# Patient Record
Sex: Male | Born: 1965 | Hispanic: Yes | Marital: Single | Smoking: Never smoker
Health system: Southern US, Community
[De-identification: ages and names within clinical notes are randomized; demographics above are authoritative.]

## PROBLEM LIST (undated history)

## (undated) DIAGNOSIS — K5090 Crohn's disease, unspecified, without complications: Secondary | ICD-10-CM

## (undated) DIAGNOSIS — E119 Type 2 diabetes mellitus without complications: Secondary | ICD-10-CM

## (undated) HISTORY — DX: Crohn's disease, unspecified, without complications: K50.90

## (undated) HISTORY — DX: Type 2 diabetes mellitus without complications: E11.9

---

## 2014-06-18 ENCOUNTER — Emergency Department (HOSPITAL_COMMUNITY)
Admission: EM | Admit: 2014-06-18 | Discharge: 2014-06-18 | Disposition: A | Discharge status: Home / Self Care | Payer: Self-pay | Source: Home / Self Care | Attending: Family Medicine | Admitting: Family Medicine

## 2014-06-18 ENCOUNTER — Encounter (HOSPITAL_COMMUNITY): Payer: Self-pay | Admitting: *Deleted

## 2014-06-18 DIAGNOSIS — R1030 Lower abdominal pain, unspecified: Secondary | ICD-10-CM

## 2014-06-18 LAB — POCT URINALYSIS DIP (DEVICE)
Bilirubin Urine: NEGATIVE
Glucose, UA: NEGATIVE mg/dL
HGB URINE DIPSTICK: NEGATIVE
Ketones, ur: NEGATIVE mg/dL
Leukocytes, UA: NEGATIVE
Nitrite: NEGATIVE
PROTEIN: NEGATIVE mg/dL
SPECIFIC GRAVITY, URINE: 1.025 (ref 1.005–1.030)
UROBILINOGEN UA: 0.2 mg/dL (ref 0.0–1.0)
pH: 7 (ref 5.0–8.0)

## 2014-06-18 MED ORDER — NAPROXEN 500 MG PO TABS: 500.0000 mg | ORAL_TABLET | Freq: Two times a day (BID) | ORAL | Status: DC

## 2014-06-18 NOTE — ED Notes (Signed)
Pt is here with complaints of 2 weeks history of lower back pain radiating to lower abdomen with painful urination. Pt has history of kidney stone 4 years ago. Pt also reports chills at night.

## 2014-06-18 NOTE — ED Provider Notes (Signed)
CSN: [id number]     Arrival date & time 06/18/14  1143 History   First MD Initiated Contact with Patient 06/18/14 1437     Chief Complaint  Patient presents with  . Urinary Tract Infection   (Consider location/radiation/quality/duration/timing/severity/associated sxs/prior Treatment) HPI      49 year old male presents complaining of possible bladder infection or kidney stones. He says that he has pain across his lower abdomen that radiates bilaterally for the past week and a half. The pain is mild. There are no alleviating or exacerbating factors. He has no associated symptoms apart from some mild lower back pain and occasional mild sensation of chills at night. He denies any risk for STDs. No penile discharge or dysuria. No testicle pain. He feels like he has trouble starting his stream of urine, he denies frequent urination, waking up at night to urinate, or feelings of incomplete emptying of his bladder. He has a history of kidney stones, his they felt somewhat like he feels today. He has a history of an umbilical hernia repair over 10 years ago.  Past Medical History  Diagnosis Date  . Hypoglycemia    History reviewed. No pertinent past surgical history. History reviewed. No pertinent family history. History  Substance Use Topics  . Smoking status: Never Smoker   . Smokeless tobacco: Not on file  . Alcohol Use: Yes    Review of Systems  Constitutional: Positive for chills. Negative for fever.  Gastrointestinal: Positive for abdominal pain. Negative for nausea, vomiting, diarrhea and constipation.  Genitourinary: Negative for flank pain.  Musculoskeletal: Positive for back pain.  All other systems reviewed and are negative.   Allergies  Review of patient's allergies indicates no known allergies.  Home Medications   Prior to Admission medications   Medication Sig Start Date End Date Taking? Authorizing Provider  naproxen (NAPROSYN) 500 MG tablet Take 1 tablet (500 mg total) by  mouth 2 (two) times daily. 06/18/14   Adrian Blackwater Disha Cottam, PA-C   BP 129/83 mmHg  Pulse 82  Temp(Src) 98.4 F (36.9 C) (Oral)  Resp 14  SpO2 98% Physical Exam  Constitutional: He is oriented to person, place, and time. He appears well-developed and well-nourished. No distress.  HENT:  Head: Normocephalic and atraumatic.  Cardiovascular: Normal rate, regular rhythm and normal heart sounds.   Pulmonary/Chest: Effort normal and breath sounds normal. No respiratory distress.  Abdominal: Soft. Bowel sounds are normal. He exhibits no distension and no mass. There is tenderness (mild) in the suprapubic area. There is no rebound, no guarding and no CVA tenderness. No hernia.  Neurological: He is alert and oriented to person, place, and time. Coordination normal.  Skin: Skin is warm and dry. No rash noted. He is not diaphoretic.  Psychiatric: He has a normal mood and affect. Judgment normal.  Nursing note and vitals reviewed.   ED Course  Procedures (including critical care time) Labs Review Labs Reviewed  URINE CULTURE  POCT URINALYSIS DIP (DEVICE)    Imaging Review No results found.   MDM   1. Abdominal pain, lower    Abdominal pain, unclear cause. Urinalysis is normal. He is afebrile and nontoxic but no signs of peritoneal inflammation or acute abdomen. Treat pain with Naprosyn twice a day for a week, follow-up if no improvement in that time. Urine culture sent.      Graylon Good, PA-C 06/18/14 [phone]

## 2014-06-18 NOTE — Discharge Instructions (Signed)

## 2014-06-19 LAB — URINE CULTURE
Colony Count: NO GROWTH
Culture: NO GROWTH

## 2014-07-20 ENCOUNTER — Encounter (HOSPITAL_COMMUNITY): Payer: Self-pay | Admitting: Emergency Medicine

## 2014-07-20 ENCOUNTER — Emergency Department (INDEPENDENT_AMBULATORY_CARE_PROVIDER_SITE_OTHER)
Admission: EM | Admit: 2014-07-20 | Discharge: 2014-07-20 | Disposition: A | Discharge status: Home / Self Care | Payer: Self-pay | Source: Home / Self Care | Attending: Family Medicine | Admitting: Family Medicine

## 2014-07-20 ENCOUNTER — Emergency Department (INDEPENDENT_AMBULATORY_CARE_PROVIDER_SITE_OTHER): Payer: Self-pay

## 2014-07-20 DIAGNOSIS — R1033 Periumbilical pain: Secondary | ICD-10-CM

## 2014-07-20 LAB — POCT URINALYSIS DIP (DEVICE)
BILIRUBIN URINE: NEGATIVE
GLUCOSE, UA: NEGATIVE mg/dL
Hgb urine dipstick: NEGATIVE
Ketones, ur: NEGATIVE mg/dL
LEUKOCYTES UA: NEGATIVE
Nitrite: NEGATIVE
PROTEIN: NEGATIVE mg/dL
Specific Gravity, Urine: 1.025 (ref 1.005–1.030)
UROBILINOGEN UA: 1 mg/dL (ref 0.0–1.0)
pH: 6 (ref 5.0–8.0)

## 2014-07-20 MED ORDER — NAPROXEN 500 MG PO TABS: 500.0000 mg | ORAL_TABLET | Freq: Two times a day (BID) | ORAL | Status: DC

## 2014-07-20 NOTE — ED Notes (Signed)
C/o intermittent abd pain near the naval area; seen here on 5/3; given Naproxen 500 mg w/relief Hx of umbilical hernia Denies fevers, chills, urinary sx Alert, no signs of acute distress.

## 2014-07-20 NOTE — Discharge Instructions (Signed)
Thank you for coming in today.  Please call or see Ms Antionette Char for assistance with your bill.  You may qualify for reduced or free services.  Her phone number is [phone]. Her email is [email or web address]  [facility] [address] [place], [place] [address] [phone]  Dolor abdominal (Abdominal Pain) El dolor puede tener muchas causas. Normalmente la causa del dolor abdominal no es una enfermedad y Scientist, clinical (histocompatibility and immunogenetics) sin TEFL teacher. Frecuentemente puede controlarse y tratarse en casa. Su mdico le Medical sales representative examen fsico y posiblemente solicite anlisis de sangre y radiografas para ayudar a Chief Strategy Officer la gravedad de su dolor. Sin embargo, en [employer], debe transcurrir ms tiempo antes de que se pueda Clinical research associate una causa evidente del dolor. Antes de llegar a ese punto, es posible que su mdico no sepa si necesita ms pruebas o un tratamiento ms profundo. INSTRUCCIONES PARA EL CUIDADO EN EL HOGAR  Est atento al dolor para ver si hay cambios. Las siguientes indicaciones ayudarn a Architectural technologist que pueda sentir:  Altus solo medicamentos de venta libre o recetados, segn las indicaciones del mdico.  No tome laxantes a menos que se lo haya indicado su mdico.  Pruebe con Neomia Dear dieta lquida absoluta (caldo, t o agua) segn se lo indique su mdico. Introduzca gradualmente una dieta normal, segn su tolerancia. SOLICITE ATENCIN MDICA SI:  Tiene dolor abdominal sin explicacin.  Tiene dolor abdominal relacionado con nuseas o diarrea.  Tiene dolor cuando orina o defeca.  Experimenta dolor abdominal que lo despierta de noche.  Tiene dolor abdominal que empeora o mejora cuando come alimentos.  Tiene dolor abdominal que empeora cuando come alimentos grasosos.  Tiene fiebre. SOLICITE ATENCIN MDICA DE INMEDIATO SI:   El dolor no desaparece en un plazo mximo de 2horas.  No deja de (vomitar).  El Engineer, mining  se siente solo en partes del abdomen, como el lado derecho o la parte inferior izquierda del abdomen.  Evaca materia fecal sanguinolenta o negra, de aspecto alquitranado. ASEGRESE DE QUE:  Comprende estas instrucciones.  Controlar su afeccin.  Recibir ayuda de inmediato si no mejora o si empeora. Document Released: 02/01/2005 Document Revised: 02/06/2013 [facility] Patient Information 2015 [place], [place]. This information is not intended to replace advice given to you by your health care provider. Make sure you discuss any questions you have with your health care provider.

## 2014-07-20 NOTE — ED Provider Notes (Signed)
Barry Carson is a 49 y.o. male who presents to Urgent Care today for abdominal pain. Patient had an umbilical hernia repair about 10 years ago. Over the past month or 2 he's had intermittent sharp pain at the right side of the umbilical hernia scar. This is tender to touch. He denies any vomiting or diarrhea fevers or chills. He was seen about a month ago in urgent care and given naproxen which helps. He feels well otherwise. The pain is moderate and interfering with work.   Past Medical History  Diagnosis Date  . Hypoglycemia    History reviewed. No pertinent past surgical history. History  Substance Use Topics  . Smoking status: Never Smoker   . Smokeless tobacco: Not on file  . Alcohol Use: Yes   ROS as above Medications: No current facility-administered medications for this encounter.   Current Outpatient Prescriptions  Medication Sig Dispense Refill  . naproxen (NAPROSYN) 500 MG tablet Take 1 tablet (500 mg total) by mouth 2 (two) times daily. 30 tablet 0   No Known Allergies   Exam:  BP 145/72 mmHg  Pulse 98  Temp(Src) 99.9 F (37.7 C) (Oral)  Resp 16  SpO2 98% Gen: Well NAD HEENT: EOMI,  MMM Lungs: Normal work of breathing. CTABL Heart: RRR no MRG Abd: NABS, Soft. Nondistended, well-appearing mature umbilical hernia scar. Tender palpation right side. No masses palpated. Pain is decreased with abdominal wall flexion. No herniation visible. Exts: Brisk capillary refill, warm and well perfused.   Results for orders placed or performed during the hospital encounter of 07/20/14 (from the past 24 hour(s))  POCT urinalysis dip (device)     Status: None   Collection Time: 07/20/14  1:31 PM  Result Value Ref Range   Glucose, UA NEGATIVE NEGATIVE mg/dL   Bilirubin Urine NEGATIVE NEGATIVE   Ketones, ur NEGATIVE NEGATIVE mg/dL   Specific Gravity, Urine 1.025 1.005 - 1.030   Hgb urine dipstick NEGATIVE NEGATIVE   pH 6.0 5.0 - 8.0   Protein, ur NEGATIVE NEGATIVE  mg/dL   Urobilinogen, UA 1.0 0.0 - 1.0 mg/dL   Nitrite NEGATIVE NEGATIVE   Leukocytes, UA NEGATIVE NEGATIVE   Dg Abd 1 View  07/20/2014   CLINICAL DATA:  Right paraumbilical abdominal pain for 1 month. Hernia repair 20 years ago.  EXAM: ABDOMEN - 1 VIEW  COMPARISON:  None.  FINDINGS: The bowel gas pattern is normal. No radio-opaque calculi or other significant radiographic abnormality are seen. Phleboliths are noted within the anatomic pelvis.  IMPRESSION: Negative abdomen radiographs.   Electronically Signed   By: Marin Roberts M.D.   On: 07/20/2014 14:33    Assessment and Plan: 49 y.o. male with abdominal pain. I think this pain is related to his umbilical hernia repair.   He appears well. He does not have any pain in his abdomen aside from when palpated directly overlying the umbilical hernia scar. I do not think he has an acute abdomen currently. Plan to refer to [facility] community health and wellness for eventual further diagnostic imaging. He will be best served by general surgery evaluation however he does not have health insurance therefore I am unable to make this happen today. Continue naproxen as needed.  Discussed warning signs or symptoms. Please see discharge instructions. Patient expresses understanding.     Rodolph Bong, MD 07/20/14 [phone]

## 2014-11-16 DIAGNOSIS — K5090 Crohn's disease, unspecified, without complications: Secondary | ICD-10-CM

## 2014-11-16 HISTORY — DX: Crohn's disease, unspecified, without complications: K50.90

## 2014-11-19 ENCOUNTER — Emergency Department (HOSPITAL_COMMUNITY): Payer: Self-pay

## 2014-11-19 ENCOUNTER — Encounter (HOSPITAL_COMMUNITY): Payer: Self-pay | Admitting: Emergency Medicine

## 2014-11-19 ENCOUNTER — Observation Stay (HOSPITAL_COMMUNITY)
Admission: EM | Admit: 2014-11-19 | Discharge: 2014-11-21 | Disposition: A | Discharge status: Home / Self Care | Payer: Self-pay | Attending: Internal Medicine | Admitting: Internal Medicine

## 2014-11-19 DIAGNOSIS — L02211 Cutaneous abscess of abdominal wall: Secondary | ICD-10-CM | POA: Insufficient documentation

## 2014-11-19 DIAGNOSIS — K50914 Crohn's disease, unspecified, with abscess: Secondary | ICD-10-CM | POA: Diagnosis present

## 2014-11-19 DIAGNOSIS — K50114 Crohn's disease of large intestine with abscess: Secondary | ICD-10-CM

## 2014-11-19 DIAGNOSIS — K529 Noninfective gastroenteritis and colitis, unspecified: Secondary | ICD-10-CM

## 2014-11-19 DIAGNOSIS — D72829 Elevated white blood cell count, unspecified: Secondary | ICD-10-CM

## 2014-11-19 DIAGNOSIS — K5090 Crohn's disease, unspecified, without complications: Secondary | ICD-10-CM | POA: Diagnosis present

## 2014-11-19 DIAGNOSIS — K651 Peritoneal abscess: Secondary | ICD-10-CM

## 2014-11-19 DIAGNOSIS — K5000 Crohn's disease of small intestine without complications: Principal | ICD-10-CM | POA: Insufficient documentation

## 2014-11-19 LAB — FOLATE: Folate: 27 ng/mL (ref >5.9)

## 2014-11-19 LAB — CBC
HCT: 40.5 % (ref 39.0–52.0)
Hemoglobin: 13.2 g/dL (ref 13.0–17.0)
MCH: 25.7 pg — ABNORMAL LOW (ref 26.0–34.0)
MCHC: 32.6 g/dL (ref 30.0–36.0)
MCV: 78.8 fL (ref 78.0–100.0)
PLATELETS: 436 10*3/uL — AB (ref 150–400)
RBC: 5.14 MIL/uL (ref 4.22–5.81)
RDW: 13.9 % (ref 11.5–15.5)
WBC: 12.6 10*3/uL — AB (ref 4.0–10.5)

## 2014-11-19 LAB — COMPREHENSIVE METABOLIC PANEL
ALT: 25 U/L (ref 17–63)
ANION GAP: 8 (ref 5–15)
AST: 22 U/L (ref 15–41)
Albumin: 3.2 g/dL — ABNORMAL LOW (ref 3.5–5.0)
Alkaline Phosphatase: 71 U/L (ref 38–126)
BUN: 7 mg/dL (ref 6–20)
CALCIUM: 9.3 mg/dL (ref 8.9–10.3)
CO2: 28 mmol/L (ref 22–32)
CREATININE: 0.93 mg/dL (ref 0.61–1.24)
Chloride: 104 mmol/L (ref 101–111)
Glucose, Bld: 114 mg/dL — ABNORMAL HIGH (ref 65–99)
Potassium: 3.9 mmol/L (ref 3.5–5.1)
Sodium: 140 mmol/L (ref 135–145)
Total Bilirubin: 0.3 mg/dL (ref 0.3–1.2)
Total Protein: 7 g/dL (ref 6.5–8.1)

## 2014-11-19 LAB — URINALYSIS, ROUTINE W REFLEX MICROSCOPIC
Bilirubin Urine: NEGATIVE
GLUCOSE, UA: NEGATIVE mg/dL
Hgb urine dipstick: NEGATIVE
KETONES UR: NEGATIVE mg/dL
LEUKOCYTES UA: NEGATIVE
NITRITE: NEGATIVE
Protein, ur: NEGATIVE mg/dL
Specific Gravity, Urine: 1.02 (ref 1.005–1.030)
UROBILINOGEN UA: 1 mg/dL (ref 0.0–1.0)
pH: 7 (ref 5.0–8.0)

## 2014-11-19 LAB — DIFFERENTIAL
BASOS ABS: 0.1 10*3/uL (ref 0.0–0.1)
BASOS PCT: 1 %
Eosinophils Absolute: 0.4 10*3/uL (ref 0.0–0.7)
Eosinophils Relative: 3 %
Lymphocytes Relative: 8 %
Lymphs Abs: 1 10*3/uL (ref 0.7–4.0)
MONO ABS: 1 10*3/uL (ref 0.1–1.0)
Monocytes Relative: 8 %
Neutro Abs: 10.1 10*3/uL — ABNORMAL HIGH (ref 1.7–7.7)
Neutrophils Relative %: 80 %

## 2014-11-19 LAB — C-REACTIVE PROTEIN: CRP: 10.8 mg/dL — ABNORMAL HIGH (ref <1.0)

## 2014-11-19 LAB — IRON AND TIBC
Iron: 22 ug/dL — ABNORMAL LOW (ref 45–182)
SATURATION RATIOS: 8 % — AB (ref 17.9–39.5)
TIBC: 293 ug/dL (ref 250–450)
UIBC: 271 ug/dL

## 2014-11-19 LAB — SEDIMENTATION RATE: Sed Rate: 41 mm/hr — ABNORMAL HIGH (ref 0–16)

## 2014-11-19 LAB — OCCULT BLOOD X 1 CARD TO LAB, STOOL: Fecal Occult Bld: NEGATIVE

## 2014-11-19 LAB — LIPASE, BLOOD: Lipase: 19 U/L — ABNORMAL LOW (ref 22–51)

## 2014-11-19 LAB — FERRITIN: Ferritin: 142 ng/mL (ref 24–336)

## 2014-11-19 LAB — VITAMIN B12: VITAMIN B 12: 307 pg/mL (ref 180–914)

## 2014-11-19 MED ORDER — IOHEXOL 300 MG/ML  SOLN: 25.0000 mL | INTRAMUSCULAR | Status: DC

## 2014-11-19 MED ORDER — IOHEXOL 300 MG/ML  SOLN
25.0000 mL | INTRAMUSCULAR | Status: AC
  Administered 2014-11-19: 25 mL via ORAL

## 2014-11-19 MED ORDER — HYDROMORPHONE HCL 1 MG/ML IJ SOLN: 1.0000 mg | INTRAMUSCULAR | Status: DC | PRN

## 2014-11-19 MED ORDER — SULFASALAZINE 500 MG PO TBEC
1000.0000 mg | DELAYED_RELEASE_TABLET | Freq: Three times a day (TID) | ORAL | Status: DC
Start: 2014-11-19 — End: 2014-11-21
  Administered 2014-11-19 – 2014-11-21 (×5): 1000 mg via ORAL
  Filled 2014-11-19 (×5): qty 2

## 2014-11-19 MED ORDER — ONDANSETRON HCL 4 MG/2ML IJ SOLN
4.0000 mg | Freq: Four times a day (QID) | INTRAMUSCULAR | Status: DC | PRN
  Administered 2014-11-20: 4 mg via INTRAVENOUS
  Filled 2014-11-19: qty 2

## 2014-11-19 MED ORDER — ONDANSETRON HCL 4 MG/2ML IJ SOLN: 4.0000 mg | Freq: Three times a day (TID) | INTRAMUSCULAR | Status: DC | PRN

## 2014-11-19 MED ORDER — ENOXAPARIN SODIUM 40 MG/0.4ML ~~LOC~~ SOLN
40.0000 mg | SUBCUTANEOUS | Status: DC
  Administered 2014-11-19 – 2014-11-20 (×2): 40 mg via SUBCUTANEOUS
  Filled 2014-11-19 (×2): qty 0.4

## 2014-11-19 MED ORDER — GI COCKTAIL ~~LOC~~
30.0000 mL | Freq: Once | ORAL | Status: AC
  Administered 2014-11-19: 30 mL via ORAL
  Filled 2014-11-19: qty 30

## 2014-11-19 MED ORDER — ONDANSETRON HCL 4 MG PO TABS: 4.0000 mg | ORAL_TABLET | Freq: Four times a day (QID) | ORAL | Status: DC | PRN

## 2014-11-19 MED ORDER — IOHEXOL 300 MG/ML  SOLN
100.0000 mL | Freq: Once | INTRAMUSCULAR | Status: AC | PRN
  Administered 2014-11-19: 100 mL via INTRAVENOUS

## 2014-11-19 MED ORDER — MORPHINE SULFATE (PF) 2 MG/ML IV SOLN
2.0000 mg | INTRAVENOUS | Status: DC | PRN
  Administered 2014-11-20: 2 mg via INTRAVENOUS
  Filled 2014-11-19: qty 1

## 2014-11-19 NOTE — H&P (Signed)
Date: 11/19/2014               Barry Carson Name:  Barry Carson MRN: [id number]  DOB: [birth year] Age / Sex: 49 y.o., male   PCP: No primary care provider on file.         Medical Service: Internal Medicine Teaching Service         Attending Physician: Dr. Sid Falcon, MD    First Contact: Florentina Addison Pager: [phone]  Second Contact: Dr. Osa Craver Pager: [phone]       After Hours (After 5p/  First Contact Pager: [phone]  weekends / holidays): Second Contact Pager: [phone]   Chief Complaint: Abdominal Pain  History of Present Illness: Barry Carson is a 49 yo male with PMHx of hernia repair presents to the ED with complaint of a 3 months history of dull periumbilical, LLQ and RLQ abdominal pain. Pain is intermittent, worse with bending over and worse with defecation. He is unaware of anything that makes it better. He does admits to occasional subjective fevers. Overall, the pain has been worsening over the last few months. He has never had pain like this before. He has a remote history of umbilical hernia repair 20 years ago. Pain is severe, 7/10. He also admits to a 15 pound weight loss in the last 3 months, but 6 pounds in the last 2 weeks. This pain is not associated with nausea, vomiting, diarrhea, dysuria, or blood in his stool. He denies any significant family history. He denies alcohol and tobacco use. He denies abusing NSAIDs. He does not take any medications on a daily basis. He does have remote history of kidney stone which passed on its own.   Barry Carson denies any history or current symptoms of arthritis, eye pain, vision changes, skin changes, hx of blood clots.   In the ED, Barry Carson was afebrile, normotensive, tachycardic to 102, satting well on room air. CMET was unremarkable including a normal lipase. CBC revealed a mild leukocytosis of 12.6. UA was normal. CT abdomen/pelvis revealed extensive Crohn's disease of distal ileum and cecucm with focal inflammation in the  mesentery adject at the ileocecal valve with 2 tiny abscesses in the mesentery each <1 cm.   Meds: Current Facility-Administered Medications  Medication Dose Route Frequency Provider Last Rate Last Dose  . enoxaparin (LOVENOX) injection 40 mg  40 mg Subcutaneous Q24H Alexa Sherral Hammers, MD      . ondansetron [facility]) tablet 4 mg  4 mg Oral Q6H PRN Alexa Sherral Hammers, MD       Or  . ondansetron (ZOFRAN) injection 4 mg  4 mg Intravenous Q6H PRN Alexa Sherral Hammers, MD        Allergies: Allergies as of 11/19/2014 - Review Complete 11/19/2014  Allergen Reaction Noted  . Tylenol [acetaminophen] Shortness Of Breath 11/19/2014   Past Medical History  Diagnosis Date  . Hypoglycemia    History reviewed. No pertinent past surgical history. No family history on file. Social History   Social History  . Marital Status: Single    Spouse Name: N/A  . Number of Children: N/A  . Years of Education: N/A   Occupational History  . Not on file.   Social History Main Topics  . Smoking status: Never Smoker   . Smokeless tobacco: Not on file  . Alcohol Use: Yes     Comment: socially  . Drug Use: No  . Sexual Activity: Not on file   Other Topics Concern  . Not  on file   Social History Narrative   Review of Systems: General: Admits to fever, chills, and decreased appetite. Denies fatigue, and diaphoresis.  Respiratory: Denies SOB, cough, and wheezing.   Cardiovascular: Denies chest pain and palpitations.  Gastrointestinal: Admits to abdominal pain. Denies nausea, vomiting, diarrhea, constipation, blood in stool and abdominal distention.  Genitourinary: Denies dysuria, urgency, frequency, hematuria Skin: Denies pallor, rash and wounds.  Neurological: Denies dizziness, headaches, weakness, lightheadedness  Physical Exam: Filed Vitals:   11/19/14 1620 11/19/14 1630 11/19/14 1715 11/19/14 1805  BP: 130/76 123/78 128/85 133/80  Pulse: 102 93  91  Temp:    98.6 F (37 C)  TempSrc:     Oral  Resp: 19   16  Height:      Weight:      SpO2: 96% 95% 98% 94%   General: Vital signs reviewed.  Barry Carson is well-developed and well-nourished, in no acute distress and cooperative with exam.  HEENT: Normocephalic and atraumatic. EOMI, conjunctivae normal, no scleral icterus. Supple, trachea midline, normal ROM.  Cardiovascular: RRR, S1 normal, S2 normal, no murmurs, gallops, or rubs. Pulmonary/Chest: Clear to auscultation bilaterally, no wheezes, rales, or rhonchi. Abdominal: Soft, tender to palpation diffusely-worst in RLQ, but also present in LLQ, RUQ and periumbilically, non-distended, BS +, no masses, organomegaly, or guarding present.  Musculoskeletal: No joint deformities, erythema, or stiffness. Extremities: No lower extremity edema bilaterally Neurological: A&O x3 Skin: Warm, dry and intact. No rashes or erythema. Psychiatric: Normal mood and affect. speech and behavior is normal. Cognition and memory are normal.   Lab results: Basic Metabolic Panel:  Recent Labs  11/19/14 1100  NA 140  K 3.9  CL 104  CO2 28  GLUCOSE 114*  BUN 7  CREATININE 0.93  CALCIUM 9.3   Liver Function Tests:  Recent Labs  11/19/14 1100  AST 22  ALT 25  ALKPHOS 71  BILITOT 0.3  PROT 7.0  ALBUMIN 3.2*    Recent Labs  11/19/14 1100  LIPASE 19*   CBC:  Recent Labs  11/19/14 1100  WBC 12.6*  NEUTROABS 10.1*  HGB 13.2  HCT 40.5  MCV 78.8  PLT 436*   Urinalysis:  Recent Labs  11/19/14 1155  COLORURINE YELLOW  LABSPEC 1.020  PHURINE 7.0  GLUCOSEU NEGATIVE  HGBUR NEGATIVE  BILIRUBINUR NEGATIVE  KETONESUR NEGATIVE  PROTEINUR NEGATIVE  UROBILINOGEN 1.0  NITRITE NEGATIVE  LEUKOCYTESUR NEGATIVE   Imaging results:  Ct Abdomen Pelvis W Contrast  11/19/2014   CLINICAL DATA:  Right lower quadrant pain for 3-4 months. Night sweats.  EXAM: CT ABDOMEN AND PELVIS WITH CONTRAST  TECHNIQUE: Multidetector CT imaging of the abdomen and pelvis was performed using the  standard protocol following bolus administration of intravenous contrast.  CONTRAST:  121m OMNIPAQUE IOHEXOL 300 MG/ML  SOLN  COMPARISON:  None.  FINDINGS: Lower chest:  Normal.  Hepatobiliary: Normal.  Pancreas: Normal.  Spleen: Normal.  Adrenals/Urinary Tract: Normal adrenal glands. Normal kidneys, ureters, and bladder.  Stomach/Bowel: There is mucosal thickening of multiple loops of the distal ileum including the terminal ileum. There is marked inflammation in the mesenteries adjacent to the ileocecal valve with two tiny areas which may represent abscesses in the mesenteric. No free air or free fluid. The appendix is immediately adjacent to the area of inflammation and there is small area of mucosal thickening in the mid appendix which is felt to be due to the adjacent ileitis and not due to appendicitis. There is slight thickening of the mucosa  of the cecum. The remainder of the bowel appears normal.  Vascular/Lymphatic: The vessels are normal. Multiple small reactive nodes in the mesenteries of the bowel.  Reproductive: Small left inguinal hernia containing only fat. Otherwise normal.  Musculoskeletal: No significant abnormality.  IMPRESSION: Findings consistent with extensive Crohn's disease of the distal ileum and cecum with focal inflammation in the mesentery adjacent at the ileocecal valve with 2 tiny abscesses in the mesentery each measuring less than 1 cm in size.   Electronically Signed   By: Lorriane Shire M.D.   On: 11/19/2014 15:36    Assessment & Plan by Problem: Active Problems:   Intra-abdominal abscess (HCC)   Crohn's colitis (A[facility])   Leukocytosis   Crohn disease ([place])  Likely New Diagnosis Mild to Moderate Crohn's Disease: Barry Carson presents with 3 month history of worsening abdominal pain without N/V/D or blood in his stool. He has had associated fevers and weight loss. Afebrile, mildly tachycardic, mild leukocytosis. Normal liver enzymes, negative lipase, normal UA. CT abdomen/pelvis  revealed extensive Crohn's disease of distal ileum and cecucm with focal inflammation in the mesentery adject at the ileocecal valve with 2 tiny abscesses in the mesentery each <1 cm. Presentation is consistent with Crohn's Disease. By definition using the Crohn's Disease Acitivity Index calculator, Barry Carson has mild to moderate Crohn disease. Barry Carson is ambulatory, tolerates a normal diet without dehydration. No evidence of toxicity. However, Barry Carson does have subjective fevers, weight loss, and abdominal pain and tenderness which do increase consideration of moderate to severe Crohn's Disease. Recommendations are to begin sulfasalazine 2-4 grams/day in patients with ileocolitis. If Barry Carson fails this treatment after 3-4 weeks, recommendation would be to start abx or steroids. We will consult GI in the morning for medications assistance and possible colonoscopy/endoscopy with biopsy for diagnosis. These tests may be done as outpatient possibly.  -Regular diet -NPO at midnight for possible EGD/Colonoscopy tomorrow am -Consult GI in the morning -Sulfasalazine 1000 mg TID -Morphine 2 mg IV Q4H prn pain -Avoid NSAIDs -CBC tomorrow am -BMET tomorrow am -CRP -Ferritin -Folate -HIV ab -Iron/TIBC -FOBT -ESR -Vitamin B12 -Vitamin D  DVT/PE ppx: Lovenox SQ QD  Dispo: Disposition is deferred at this time, awaiting improvement of current medical problems. Anticipated discharge in approximately 1-2 day(s).   The Barry Carson does not have a current PCP (No primary care provider on file.) and does need an [facility] hospital follow-up appointment after discharge.  The Barry Carson does not have transportation limitations that hinder transportation to clinic appointments.  Signed: Osa Craver, DO PGY-2 Internal Medicine Resident Pager # [phone] 11/19/2014 6:34 PM

## 2014-11-19 NOTE — ED Notes (Signed)
Patient states RLQ pain x 1 month.   Patient states he has been having flu-like symptoms also.   Patient denies N/V/D.

## 2014-11-19 NOTE — ED Provider Notes (Signed)
CSN: [id number]     Arrival date & time 11/19/14  3474 History   First MD Initiated Contact with Patient 11/19/14 1032     Chief Complaint  Patient presents with  . Abdominal Pain     (Consider location/radiation/quality/duration/timing/severity/associated sxs/prior Treatment) HPI Comments: Patient with past history of umbilical hernia repair presents with complaint of abdominal pain the right side of his umbilicus for the past 2 months. Pain is described as sharp. Patient describes having this pain constantly for 2 months. Prior to this he had similar pain and was seen in urgent care in 5/16 and 6/16 for similar symptoms. Plain films done at that time were negative. Patient was prescribed medication that didn't really help him and the pain went away spontaneously. Patient states that sometimes the pain is worse with eating. Sometimes it will radiate to his back. It is associated with nausea but no vomiting. No diarrhea but patient has had constipation. No urinary symptoms. No blood in the stool. Patient has tried ibuprofen and naproxen without relief. Patient denies heavy alcohol use, does drink occasionally but not to excess. Denies heavy NSAID use.  In addition patient reports "flulike symptoms" over the past 1-1/2-2 weeks. He describes nasal congestion, cough. He has tried OTC flu medications at home.  Patient is a 49 y.o. male presenting with abdominal pain. The history is provided by the patient.  Abdominal Pain Associated symptoms: cough and nausea   Associated symptoms: no chest pain, no diarrhea, no dysuria, no fever, no shortness of breath, no sore throat and no vomiting     Past Medical History  Diagnosis Date  . Hypoglycemia    History reviewed. No pertinent past surgical history. No family history on file. Social History  Substance Use Topics  . Smoking status: Never Smoker   . Smokeless tobacco: None  . Alcohol Use: Yes     Comment: socially    Review of Systems    Constitutional: Negative for fever.  HENT: Positive for congestion and rhinorrhea. Negative for sore throat.   Eyes: Negative for redness.  Respiratory: Positive for cough. Negative for shortness of breath.   Cardiovascular: Negative for chest pain.  Gastrointestinal: Positive for nausea and abdominal pain. Negative for vomiting and diarrhea.  Genitourinary: Negative for dysuria.  Musculoskeletal: Positive for back pain. Negative for myalgias.  Skin: Negative for rash.  Neurological: Negative for headaches.      Allergies  Tylenol  Home Medications   Prior to Admission medications   Medication Sig Start Date End Date Taking? Authorizing Provider  ibuprofen (ADVIL,MOTRIN) 200 MG tablet Take 400 mg by mouth every 6 (six) hours as needed for moderate pain.   Yes Historical Provider, MD  naproxen (NAPROSYN) 500 MG tablet Take 1 tablet (500 mg total) by mouth 2 (two) times daily. Patient not taking: Reported on 11/19/2014 07/20/14   Rodolph Bong, MD   BP 132/82 mmHg  Pulse 92  Temp(Src) 98.5 F (36.9 C) (Oral)  Resp 20  Ht 5\' 11"  (1.803 m)  Wt 194 lb (87.998 kg)  BMI 27.07 kg/m2  SpO2 100%   Physical Exam  Constitutional: He appears well-developed and well-nourished.  HENT:  Head: Normocephalic and atraumatic.  Nose: Nose normal.  Mouth/Throat: Oropharynx is clear and moist. No oropharyngeal exudate.  Eyes: Conjunctivae are normal. Right eye exhibits no discharge. Left eye exhibits no discharge.  Neck: Normal range of motion. Neck supple.  Cardiovascular: Normal rate, regular rhythm and normal heart sounds.   Pulmonary/Chest: Effort  normal and breath sounds normal. No respiratory distress. He has no wheezes. He has no rales.  Abdominal: Soft. Bowel sounds are normal. There is tenderness. There is no rigidity, no rebound, no guarding, no tenderness at McBurney's point and negative Murphy's sign.    Neurological: He is alert.  Skin: Skin is warm and dry.  Psychiatric: He has  a normal mood and affect.  Nursing note and vitals reviewed.   ED Course  Procedures (including critical care time) Labs Review Labs Reviewed  LIPASE, BLOOD - Abnormal; Notable for the following:    Lipase 19 (*)    All other components within normal limits  COMPREHENSIVE METABOLIC PANEL - Abnormal; Notable for the following:    Glucose, Bld 114 (*)    Albumin 3.2 (*)    All other components within normal limits  CBC - Abnormal; Notable for the following:    WBC 12.6 (*)    MCH 25.7 (*)    Platelets 436 (*)    All other components within normal limits  DIFFERENTIAL - Abnormal; Notable for the following:    Neutro Abs 10.1 (*)    All other components within normal limits  URINALYSIS, ROUTINE W REFLEX MICROSCOPIC (NOT AT [facility])    Imaging Review Ct Abdomen Pelvis W Contrast  11/19/2014   CLINICAL DATA:  Right lower quadrant pain for 3-4 months. Night sweats.  EXAM: CT ABDOMEN AND PELVIS WITH CONTRAST  TECHNIQUE: Multidetector CT imaging of the abdomen and pelvis was performed using the standard protocol following bolus administration of intravenous contrast.  CONTRAST:  OMNIPAQUE IOHEXOL 300 MG/ML  SOLN  COMPARISON:  None.  FINDINGS: Lower chest:  Normal.  Hepatobiliary: Normal.  Pancreas: Normal.  Spleen: Normal.  Adrenals/Urinary Tract: Normal adrenal glands. Normal kidneys, ureters, and bladder.  Stomach/Bowel: There is mucosal thickening of multiple loops of the distal ileum including the terminal ileum. There is marked inflammation in the mesenteries adjacent to the ileocecal valve with two tiny areas which may represent abscesses in the mesenteric. No free air or free fluid. The appendix is immediately adjacent to the area of inflammation and there is small area of mucosal thickening in the mid appendix which is felt to be due to the adjacent ileitis and not due to appendicitis. There is slight thickening of the mucosa of the cecum. The remainder of the bowel appears normal.   Vascular/Lymphatic: The vessels are normal. Multiple small reactive nodes in the mesenteries of the bowel.  Reproductive: Small left inguinal hernia containing only fat. Otherwise normal.  Musculoskeletal: No significant abnormality.  IMPRESSION: Findings consistent with extensive Crohn's disease of the distal ileum and cecum with focal inflammation in the mesentery adjacent at the ileocecal valve with 2 tiny abscesses in the mesentery each measuring less than 1 cm in size.   Electronically Signed   By: Francene Boyers M.D.   On: 11/19/2014 15:36   I have personally reviewed and evaluated these images and lab results as part of my medical decision-making.   EKG Interpretation None       11:23 AM Patient seen and examined. Work-up initiated.   Vital signs reviewed and are as follows: BP 132/82 mmHg  Pulse 92  Temp(Src) 98.5 F (36.9 C) (Oral)  Resp 20  Ht  (1.803 m)  Wt 194 lb (87.998 kg)  BMI 27.07 kg/m2  SpO2 100%  4:36 PM Patient updated on results earlier. Given poor follow-up, persistent pain, no reliable outpatient plan, mild leukocytosis -- CT ordered  to evaluate for etiology of abdominal pain. If negative, would treat as peptic ulcer disease.  CT returns showing inflammation possibly due to Crohn's disease. Patient does not have a history of this. CT shows 2 small abscesses. D/w Dr. Clarene Duke.   Patient admitted to internal medicine teaching service.   MDM   Final diagnoses:  Inflammatory bowel disease  Intra-abdominal abscess (HCC)   Admit for above. Patient appears nontoxic.    Renne Crigler, PA-C 11/19/14 1639  Laurence Spates, MD 11/20/14 1249

## 2014-11-19 NOTE — H&P (Signed)
Date: 11/19/2014               Patient Name:  Barry Carson MRN: [id number]  DOB: [birth year] Age / Sex: 49 y.o., male   PCP: No primary care provider on file.              Medical Service: Internal Medicine Teaching Service              Attending Physician: Dr. Sid Falcon, MD    First Contact: Florentina Addison , MS 4 Pager: [phone]  Second Contact: Dr. Osa Craver Pager: [phone]  Third Contact Dr.  Karalee Height: [phone]-       After Hours (After 5p/  First Contact Pager: [phone]  weekends / holidays): Second Contact Pager: [phone]   Chief Complaint:  RLQ Abdominal Pain   History of Present Illness: Of note: [place] is Spanish speaking. History was obtained through a telephone interpreter.   Barry Carson is a 49 y.o. male with a PMH of remote umbilical hernia s/p surgical repair and remote renal calculus who presents to the [facility] ED with a 3 month history of waxing and waning right lower quadrant pain. He states that over the past week the pain has worsened. He describes it as a dull, waxing and waning pain that is present daily and is 7/10 in severity. He also endorses subjective fevers and chills over the past week, an unintentional 15 lb weight loss over the past 3 months. He denies night sweats, nausea, vomiting, diarrhea, hematochezia, joint pain, skin rashes, or visual disturbances. His pain is exacerbated by defecation and bending over. He does not currently take any medications and states that he is allergic to acetaminophen. He denies tobacco and illicit drug use and endorses ETOH of 1-2 beers/week. He denies any personal history of chronic disease and any family history of GI and autoimmune disorders.   Meds: No current facility-administered medications for this encounter.   Current Outpatient Prescriptions  Medication Sig Dispense Refill  . ibuprofen (ADVIL,MOTRIN) 200 MG tablet Take 400 mg by mouth every 6 (six) hours as needed for moderate pain.    .  naproxen (NAPROSYN) 500 MG tablet Take 1 tablet (500 mg total) by mouth 2 (two) times daily. (Patient not taking: Reported on 11/19/2014) 30 tablet 0    Allergies: Allergies as of 11/19/2014 - Review Complete 11/19/2014  Allergen Reaction Noted  . Tylenol [acetaminophen] Shortness Of Breath 11/19/2014   Past Medical History  Diagnosis Date  . Hypoglycemia    History reviewed. No pertinent past surgical history. No family history on file. Social History   Social History  . Marital Status: Single    Spouse Name: N/A  . Number of Children: N/A  . Years of Education: N/A   Occupational History  . Not on file.   Social History Main Topics  . Smoking status: Never Smoker   . Smokeless tobacco: Not on file  . Alcohol Use: Yes     Comment: socially  . Drug Use: No  . Sexual Activity: Not on file   Other Topics Concern  . Not on file   Social History Narrative    Review of Systems: Pertinent items noted in HPI and remainder of comprehensive ROS otherwise negative.  Physical Exam: Blood pressure 123/78, pulse 93, temperature 98.5 F (36.9 C), temperature source Oral, resp. rate 19, height 5' 11" (1.803 m), weight 87.998 kg (194 lb), SpO2 95 %.  Physical Exam  Constitutional: He is oriented to  person, place, and time. He appears well-developed and well-nourished. No distress.  Pleasant Spanish Speaking gentleman   HENT:  Head: Normocephalic and atraumatic.  Right Ear: External ear normal.  Left Ear: External ear normal.  Eyes: Conjunctivae and EOM are normal. No scleral icterus.  Neck: Normal range of motion.  Cardiovascular: Normal rate and regular rhythm.  Exam reveals no gallop and no friction rub.   No murmur heard. Pulmonary/Chest: No respiratory distress. He has no wheezes. He has no rales. He exhibits no tenderness.  Abdominal: Soft. He exhibits no distension and no mass. There is tenderness (hypogastric and right lower quadrant tenderness to palpation ). There  is guarding. There is no rebound.  Surgical scar inferior to umbilicus consistent with hernia repair   Musculoskeletal: Normal range of motion. He exhibits no edema.  Neurological: He is alert and oriented to person, place, and time.  Skin: Skin is warm and dry. No rash noted.  Psychiatric: He has a normal mood and affect.     Lab results: Results for orders placed or performed during the hospital encounter of 11/19/14 (from the past 24 hour(s))  Lipase, blood     Status: Abnormal   Collection Time: 11/19/14 11:00 AM  Result Value Ref Range   Lipase 19 (L) 22 - 51 U/L  Comprehensive metabolic panel     Status: Abnormal   Collection Time: 11/19/14 11:00 AM  Result Value Ref Range   Sodium 140 135 - 145 mmol/L   Potassium 3.9 3.5 - 5.1 mmol/L   Chloride 104 101 - 111 mmol/L   CO2 28 22 - 32 mmol/L   Glucose, Bld 114 (H) 65 - 99 mg/dL   BUN 7 6 - 20 mg/dL   Creatinine, Ser 0.93 0.61 - 1.24 mg/dL   Calcium 9.3 8.9 - 10.3 mg/dL   Total Protein 7.0 6.5 - 8.1 g/dL   Albumin 3.2 (L) 3.5 - 5.0 g/dL   AST 22 15 - 41 U/L   ALT 25 17 - 63 U/L   Alkaline Phosphatase 71 38 - 126 U/L   Total Bilirubin 0.3 0.3 - 1.2 mg/dL   GFR calc non Af Amer >60 >60 mL/min   GFR calc Af Amer >60 >60 mL/min   Anion gap 8 5 - 15  CBC     Status: Abnormal   Collection Time: 11/19/14 11:00 AM  Result Value Ref Range   WBC 12.6 (H) 4.0 - 10.5 K/uL   RBC 5.14 4.22 - 5.81 MIL/uL   Hemoglobin 13.2 13.0 - 17.0 g/dL   HCT 40.5 39.0 - 52.0 %   MCV 78.8 78.0 - 100.0 fL   MCH 25.7 (L) 26.0 - 34.0 pg   MCHC 32.6 30.0 - 36.0 g/dL   RDW 13.9 11.5 - 15.5 %   Platelets 436 (H) 150 - 400 K/uL  Differential     Status: Abnormal   Collection Time: 11/19/14 11:00 AM  Result Value Ref Range   Neutrophils Relative % 80 %   Neutro Abs 10.1 (H) 1.7 - 7.7 K/uL   Lymphocytes Relative 8 %   Lymphs Abs 1.0 0.7 - 4.0 K/uL   Monocytes Relative 8 %   Monocytes Absolute 1.0 0.1 - 1.0 K/uL   Eosinophils Relative 3 %    Eosinophils Absolute 0.4 0.0 - 0.7 K/uL   Basophils Relative 1 %   Basophils Absolute 0.1 0.0 - 0.1 K/uL  Urinalysis, Routine w reflex microscopic (not at [facility])     Status: None  Collection Time: 11/19/14 11:55 AM  Result Value Ref Range   Color, Urine YELLOW YELLOW   APPearance CLEAR CLEAR   Specific Gravity, Urine 1.020 1.005 - 1.030   pH 7.0 5.0 - 8.0   Glucose, UA NEGATIVE NEGATIVE mg/dL   Hgb urine dipstick NEGATIVE NEGATIVE   Bilirubin Urine NEGATIVE NEGATIVE   Ketones, ur NEGATIVE NEGATIVE mg/dL   Protein, ur NEGATIVE NEGATIVE mg/dL   Urobilinogen, UA 1.0 0.0 - 1.0 mg/dL   Nitrite NEGATIVE NEGATIVE   Leukocytes, UA NEGATIVE NEGATIVE     Imaging results:  Ct Abdomen Pelvis W Contrast  11/19/2014   CLINICAL DATA:  Right lower quadrant pain for 3-4 months. Night sweats.  EXAM: CT ABDOMEN AND PELVIS WITH CONTRAST  TECHNIQUE: Multidetector CT imaging of the abdomen and pelvis was performed using the standard protocol following bolus administration of intravenous contrast.  CONTRAST:  171m OMNIPAQUE IOHEXOL 300 MG/ML  SOLN  COMPARISON:  None.  FINDINGS: Lower chest:  Normal.  Hepatobiliary: Normal.  Pancreas: Normal.  Spleen: Normal.  Adrenals/Urinary Tract: Normal adrenal glands. Normal kidneys, ureters, and bladder.  Stomach/Bowel: There is mucosal thickening of multiple loops of the distal ileum including the terminal ileum. There is marked inflammation in the mesenteries adjacent to the ileocecal valve with two tiny areas which may represent abscesses in the mesenteric. No free air or free fluid. The appendix is immediately adjacent to the area of inflammation and there is small area of mucosal thickening in the mid appendix which is felt to be due to the adjacent ileitis and not due to appendicitis. There is slight thickening of the mucosa of the cecum. The remainder of the bowel appears normal.  Vascular/Lymphatic: The vessels are normal. Multiple small reactive nodes in the  mesenteries of the bowel.  Reproductive: Small left inguinal hernia containing only fat. Otherwise normal.  Musculoskeletal: No significant abnormality.  IMPRESSION: Findings consistent with extensive Crohn's disease of the distal ileum and cecum with focal inflammation in the mesentery adjacent at the ileocecal valve with 2 tiny abscesses in the mesentery each measuring less than 1 cm in size.   Electronically Signed   By: JLorriane ShireM.D.   On: 11/19/2014 15:36    Other results:   Assessment & Plan by Problem: Active Problems:   Intra-abdominal abscess ([place]   Crohn's colitis ([facility]   Leukocytosis   Summary: JAdon Gehlhausenis a 49y.o. male who presents with a 3 month history of RLQ abdominal tenderness  In the context of a 15 lb unintentional weight loss. Subsequent abdominal CT concerning for Ileocecal Crohn's colitis with two small mesenteric abscesses.   ##RLQ Abdominal Pain, suspected Crohn's Colitis:  CT abdomen revealed finding consistent w/ extensive Chron's disease of the distal ileum and cecum with focal mesenteric inflammation. WBC 12.6. CDAI scale 203= mild to moderate Crohn's disease. Holding steroids 2/2 to abscess.  -Pain control w/ IV morphine  2 mg q6h for severe pain -Consult GI tomorrow, will need colonoscopy, NPO at midnight  -f/u ESR, CRP, BMP, Serum Iron/Ferritin/B12, folate,Vitamin D, HbA1c -monitor daily CBC  -Sulfasalazine 1000 mg TID (total 3 g/daily) for induction of remission  -Ondansetron 4 mg Q6 PRN for nausea   ##Intra-abdominal Abscess:  Mesenteric abscesses appreciated on abdominal CT, likely secondary to Crohn's disease. Concern for risk of sepsis given elevated WBC 12.6.  Patient currently stable. Consult GI for medical vs drainage vs surgical management -Consult GI in the morning  -Blood culture if spike fever -Low threshold  for Ab therapy if spike fever >38.5 or hypotensive  ##Leukocytosis: WBC 12.6 with left shift on admission. Likely 2/2 to  active Crohn's. Currently afebrile.  -Consider antibiotic therapy if spike fever  -Blood culture if spike fever >38.5 -Monitor daily CBC  ##Infectious Disease: LFT normal, no concern for hepatitis -f/u HIV   ##DVT PPx: -Lovenox 40 SQ daily   This is a Careers information officer Note.  The care of the patient was discussed with Dr. Marvel Plan  and the assessment and plan was formulated with their assistance.  Please see their note for official documentation of the patient encounter.   Signed: Florentina Addison, Med Student 11/19/2014, 4:53 PM

## 2014-11-19 NOTE — ED Notes (Signed)
Admitting MD at bedside.

## 2014-11-19 NOTE — ED Notes (Signed)
Report attempted 

## 2014-11-19 NOTE — ED Notes (Signed)
Pt reports cold chills at night with fever, has not checked temperature at home.  Denies congestion, body aches, cough, or other symptoms.

## 2014-11-20 DIAGNOSIS — K50914 Crohn's disease, unspecified, with abscess: Secondary | ICD-10-CM | POA: Diagnosis present

## 2014-11-20 DIAGNOSIS — K50014 Crohn's disease of small intestine with abscess: Secondary | ICD-10-CM

## 2014-11-20 LAB — CBC
HCT: 40.3 % (ref 39.0–52.0)
HEMOGLOBIN: 13.1 g/dL (ref 13.0–17.0)
MCH: 25.7 pg — AB (ref 26.0–34.0)
MCHC: 32.5 g/dL (ref 30.0–36.0)
MCV: 79.2 fL (ref 78.0–100.0)
Platelets: 446 10*3/uL — ABNORMAL HIGH (ref 150–400)
RBC: 5.09 MIL/uL (ref 4.22–5.81)
RDW: 14.2 % (ref 11.5–15.5)
WBC: 11.7 10*3/uL — ABNORMAL HIGH (ref 4.0–10.5)

## 2014-11-20 LAB — BASIC METABOLIC PANEL
ANION GAP: 9 (ref 5–15)
BUN: 8 mg/dL (ref 6–20)
CHLORIDE: 99 mmol/L — AB (ref 101–111)
CO2: 27 mmol/L (ref 22–32)
Calcium: 8.8 mg/dL — ABNORMAL LOW (ref 8.9–10.3)
Creatinine, Ser: 0.95 mg/dL (ref 0.61–1.24)
GFR calc Af Amer: >60 mL/min (ref >60)
GFR calc non Af Amer: >60 mL/min (ref >60)
GLUCOSE: 98 mg/dL (ref 65–99)
POTASSIUM: 4.1 mmol/L (ref 3.5–5.1)
Sodium: 135 mmol/L (ref 135–145)

## 2014-11-20 LAB — HEMOGLOBIN A1C
Hgb A1c MFr Bld: 6.6 % — ABNORMAL HIGH (ref 4.8–5.6)
Mean Plasma Glucose: 143 mg/dL

## 2014-11-20 LAB — HIV ANTIBODY (ROUTINE TESTING W REFLEX): HIV SCREEN 4TH GENERATION: NONREACTIVE

## 2014-11-20 MED ORDER — CIPROFLOXACIN HCL 500 MG PO TABS
500.0000 mg | ORAL_TABLET | Freq: Two times a day (BID) | ORAL | Status: DC
  Administered 2014-11-20 – 2014-11-21 (×3): 500 mg via ORAL
  Filled 2014-11-20 (×3): qty 1

## 2014-11-20 MED ORDER — PEG 3350-KCL-NA BICARB-NACL 420 G PO SOLR
4000.0000 mL | Freq: Once | ORAL | Status: AC
  Administered 2014-11-20: 4000 mL via ORAL
  Filled 2014-11-20: qty 4000

## 2014-11-20 MED ORDER — METRONIDAZOLE 500 MG PO TABS
500.0000 mg | ORAL_TABLET | Freq: Three times a day (TID) | ORAL | Status: DC
  Administered 2014-11-20 – 2014-11-21 (×5): 500 mg via ORAL
  Filled 2014-11-20 (×4): qty 1

## 2014-11-20 NOTE — Progress Notes (Signed)
Subjective:  There were no acute overnight events  Patient states that he is feeling improved. Rates his abdominal pain as 6/10, down from 7/10 at admission. He denies nausea, vomiting, diarrhea, chest pain ,hematochezia and SOB. Discussed possibility of upper and lower endoscopy.      Objective:   Temp (24hrs), Avg:98.3 F (36.8 C), Min:97.4 F (36.3 C), Max:98.6 F (37 C)    Vital signs in last 24 hours: Filed Vitals:   11/19/14 1715 11/19/14 1805 11/19/14 2230 11/20/14 0452  BP: 128/85 133/80 124/77 108/80  Pulse:  91 97 89  Temp:  98.6 F (37 C) 98.5 F (36.9 C) 97.4 F (36.3 C)  TempSrc:  Oral Oral Oral  Resp:  16 18 18   Height:      Weight:      SpO2: 98% 94% 98% 97%   Weight change:  No intake or output data in the 24 hours ending 11/20/14 9476 Physical Exam  Constitutional: He appears well-developed and well-nourished. No distress.  HENT:  Head: Normocephalic.  Eyes: Conjunctivae and EOM are normal. No scleral icterus.  Neck: Normal range of motion. No JVD present.  Cardiovascular: Normal rate and regular rhythm.  Exam reveals no gallop and no friction rub.   No murmur heard. Pulmonary/Chest: Effort normal and breath sounds normal. He has no wheezes.  Abdominal: Soft. There is tenderness (Hypogastric and RLQ).  Musculoskeletal: Normal range of motion. He exhibits no edema.  Lymphadenopathy:    He has no cervical adenopathy.  Neurological: He is alert.  Skin: Skin is warm and dry.  Psychiatric: He has a normal mood and affect.    Lab Results: Results for orders placed or performed during the hospital encounter of 11/19/14 (from the past 48 hour(s))  Lipase, blood     Status: Abnormal   Collection Time: 11/19/14 11:00 AM  Result Value Ref Range   Lipase 19 (L) 22 - 51 U/L  Comprehensive metabolic panel     Status: Abnormal   Collection Time: 11/19/14 11:00 AM  Result Value Ref Range   Sodium 140 135 - 145 mmol/L   Potassium 3.9 3.5 - 5.1 mmol/L     Chloride 104 101 - 111 mmol/L   CO2 28 22 - 32 mmol/L   Glucose, Bld 114 (H) 65 - 99 mg/dL   BUN 7 6 - 20 mg/dL   Creatinine, Ser 0.93 0.61 - 1.24 mg/dL   Calcium 9.3 8.9 - 10.3 mg/dL   Total Protein 7.0 6.5 - 8.1 g/dL   Albumin 3.2 (L) 3.5 - 5.0 g/dL   AST 22 15 - 41 U/L   ALT 25 17 - 63 U/L   Alkaline Phosphatase 71 38 - 126 U/L   Total Bilirubin 0.3 0.3 - 1.2 mg/dL   GFR calc non Af Amer >60 >60 mL/min   GFR calc Af Amer >60 >60 mL/min    Comment: (NOTE) The eGFR has been calculated using the CKD EPI equation. This calculation has not been validated in all clinical situations. eGFR's persistently <60 mL/min signify possible Chronic Kidney Disease.    Anion gap 8 5 - 15  CBC     Status: Abnormal   Collection Time: 11/19/14 11:00 AM  Result Value Ref Range   WBC 12.6 (H) 4.0 - 10.5 K/uL   RBC 5.14 4.22 - 5.81 MIL/uL   Hemoglobin 13.2 13.0 - 17.0 g/dL   HCT 40.5 39.0 - 52.0 %   MCV 78.8 78.0 - 100.0 fL   MCH 25.7 (  L) 26.0 - 34.0 pg   MCHC 32.6 30.0 - 36.0 g/dL   RDW 13.9 11.5 - 15.5 %   Platelets 436 (H) 150 - 400 K/uL  Differential     Status: Abnormal   Collection Time: 11/19/14 11:00 AM  Result Value Ref Range   Neutrophils Relative % 80 %   Neutro Abs 10.1 (H) 1.7 - 7.7 K/uL   Lymphocytes Relative 8 %   Lymphs Abs 1.0 0.7 - 4.0 K/uL   Monocytes Relative 8 %   Monocytes Absolute 1.0 0.1 - 1.0 K/uL   Eosinophils Relative 3 %   Eosinophils Absolute 0.4 0.0 - 0.7 K/uL   Basophils Relative 1 %   Basophils Absolute 0.1 0.0 - 0.1 K/uL  Urinalysis, Routine w reflex microscopic (not at [facility])     Status: None   Collection Time: 11/19/14 11:55 AM  Result Value Ref Range   Color, Urine YELLOW YELLOW   APPearance CLEAR CLEAR   Specific Gravity, Urine 1.020 1.005 - 1.030   pH 7.0 5.0 - 8.0   Glucose, UA NEGATIVE NEGATIVE mg/dL   Hgb urine dipstick NEGATIVE NEGATIVE   Bilirubin Urine NEGATIVE NEGATIVE   Ketones, ur NEGATIVE NEGATIVE mg/dL   Protein, ur NEGATIVE  NEGATIVE mg/dL   Urobilinogen, UA 1.0 0.0 - 1.0 mg/dL   Nitrite NEGATIVE NEGATIVE   Leukocytes, UA NEGATIVE NEGATIVE    Comment: MICROSCOPIC NOT DONE ON URINES WITH NEGATIVE PROTEIN, BLOOD, LEUKOCYTES, NITRITE, OR GLUCOSE <1000 mg/dL.  HIV antibody     Status: None   Collection Time: 11/19/14  7:15 PM  Result Value Ref Range   HIV Screen 4th Generation wRfx Non Reactive Non Reactive    Comment: (NOTE) Performed At: [facility] [place], [place] [id number] Lindon Romp MD [id number]   Sedimentation rate     Status: Abnormal   Collection Time: 11/19/14  7:15 PM  Result Value Ref Range   Sed Rate 41 (H) 0 - 16 mm/hr  C-reactive protein     Status: Abnormal   Collection Time: 11/19/14  7:15 PM  Result Value Ref Range   CRP 10.8 (H) <1.0 mg/dL  Vitamin B12     Status: None   Collection Time: 11/19/14  7:15 PM  Result Value Ref Range   Vitamin B-12 307 180 - 914 pg/mL    Comment: (NOTE) This assay is not validated for testing neonatal or myeloproliferative syndrome specimens for Vitamin B12 levels.   Iron and TIBC     Status: Abnormal   Collection Time: 11/19/14  7:15 PM  Result Value Ref Range   Iron 22 (L) 45 - 182 ug/dL   TIBC 293 250 - 450 ug/dL   Saturation Ratios 8 (L) 17.9 - 39.5 %   UIBC 271 ug/dL  Ferritin     Status: None   Collection Time: 11/19/14  7:15 PM  Result Value Ref Range   Ferritin 142 24 - 336 ng/mL  Folate     Status: None   Collection Time: 11/19/14  7:15 PM  Result Value Ref Range   Folate 27.0 >5.9 ng/mL  Occult blood card to lab, stool RN will collect     Status: None   Collection Time: 11/19/14 11:05 PM  Result Value Ref Range   Fecal Occult Bld NEGATIVE NEGATIVE  CBC     Status: Abnormal   Collection Time: 11/20/14  5:30 AM  Result Value Ref Range   WBC 11.7 (H) 4.0 - 10.5 K/uL  RBC 5.09 4.22 - 5.81 MIL/uL   Hemoglobin 13.1 13.0 - 17.0 g/dL   HCT 40.3 39.0 - 52.0 %   MCV 79.2 78.0 - 100.0 fL   MCH 25.7 (L)  26.0 - 34.0 pg   MCHC 32.5 30.0 - 36.0 g/dL   RDW 14.2 11.5 - 15.5 %   Platelets 446 (H) 150 - 400 K/uL  Basic metabolic panel     Status: Abnormal   Collection Time: 11/20/14  5:30 AM  Result Value Ref Range   Sodium 135 135 - 145 mmol/L   Potassium 4.1 3.5 - 5.1 mmol/L   Chloride 99 (L) 101 - 111 mmol/L   CO2 27 22 - 32 mmol/L   Glucose, Bld 98 65 - 99 mg/dL   BUN 8 6 - 20 mg/dL   Creatinine, Ser 0.95 0.61 - 1.24 mg/dL   Calcium 8.8 (L) 8.9 - 10.3 mg/dL   GFR calc non Af Amer >60 >60 mL/min   GFR calc Af Amer >60 >60 mL/min    Comment: (NOTE) The eGFR has been calculated using the CKD EPI equation. This calculation has not been validated in all clinical situations. eGFR's persistently <60 mL/min signify possible Chronic Kidney Disease.    Anion gap 9 5 - 15   Micro Results: No results found for this or any previous visit (from the past 240 hour(s)). Studies/Results: Ct Abdomen Pelvis W Contrast  11/19/2014   CLINICAL DATA:  Right lower quadrant pain for 3-4 months. Night sweats.  EXAM: CT ABDOMEN AND PELVIS WITH CONTRAST  TECHNIQUE: Multidetector CT imaging of the abdomen and pelvis was performed using the standard protocol following bolus administration of intravenous contrast.  CONTRAST:  198m OMNIPAQUE IOHEXOL 300 MG/ML  SOLN  COMPARISON:  None.  FINDINGS: Lower chest:  Normal.  Hepatobiliary: Normal.  Pancreas: Normal.  Spleen: Normal.  Adrenals/Urinary Tract: Normal adrenal glands. Normal kidneys, ureters, and bladder.  Stomach/Bowel: There is mucosal thickening of multiple loops of the distal ileum including the terminal ileum. There is marked inflammation in the mesenteries adjacent to the ileocecal valve with two tiny areas which may represent abscesses in the mesenteric. No free air or free fluid. The appendix is immediately adjacent to the area of inflammation and there is small area of mucosal thickening in the mid appendix which is felt to be due to the adjacent ileitis  and not due to appendicitis. There is slight thickening of the mucosa of the cecum. The remainder of the bowel appears normal.  Vascular/Lymphatic: The vessels are normal. Multiple small reactive nodes in the mesenteries of the bowel.  Reproductive: Small left inguinal hernia containing only fat. Otherwise normal.  Musculoskeletal: No significant abnormality.  IMPRESSION: Findings consistent with extensive Crohn's disease of the distal ileum and cecum with focal inflammation in the mesentery adjacent at the ileocecal valve with 2 tiny abscesses in the mesentery each measuring less than 1 cm in size.   Electronically Signed   By: JLorriane ShireM.D.   On: 11/19/2014 15:36   Medications: I have reviewed the patient's current medications. Scheduled Meds: . enoxaparin (LOVENOX) injection  40 mg Subcutaneous Q24H  . sulfaSALAzine  1,000 mg Oral TID   Continuous Infusions:  PRN Meds:.morphine injection, ondansetron **OR** ondansetron (ZOFRAN) IV Assessment/Plan: Active Problems:   Intra-abdominal abscess (HCC)   Crohn's colitis ([place]   Leukocytosis   Cro[place] disease ([place]  Summary: Barry Danielsenis a 49y.o. male who presents with a 3 month history of RLQ abdominal  tenderness In the context of a 15 lb unintentional weight loss. Subsequent abdominal CT concerning for Ileocecal Crohn's colitis with two small mesenteric abscesses.   ##RLQ Abdominal Pain, suspected Crohn's Colitis: CT abdomen revealed finding consistent w/ extensive Crohn's disease of the distal ileum and cecum with focal mesenteric inflammation. CDAI scale 203, inidicative of mild/moderate Crohns disease. WBC 12.6 on admission has downtrended to 11.7. ESR and CRP elevated at 41 and 10.8 respectively. FOBT negative. Iron and TIBC low with normal hemoglobin and ferritin may be indicative of early iron-deficiency anemia. GI saw patient and is planning of upper endoscopy and colonoscopy on 10/6.  -NPO at midnight in anticipation of  colonoscopy on 10/5 -Clear Diet until midnight in anticipation of colonoscopy on 10/5 -Pain control w/ IV morphine 2 mg q6h for severe pain -Sulfasalazine 1000 mg TID (total 3 g/daily) for induction of remission  -Ondansetron 4 mg Q6 PRN for nausea   -Vitamin D pending -BMP tommorrow  ##Intra-abdominal Abscess: Mesenteric abscesses appreciated on abdominal CT, likely secondary to Crohn's disease. Concern for risk of sepsis given elevated WBC 12.6. Patient currently afebrile and stable. Medical management of abscesses with antibiotics is indicated at this time,.  -Ciprofloxacin 500 mg po BID and Flagyl 500 mg po q8h for treatment of abscesses. -Blood culture if spike fever -Low threshold for Ab therapy if spike fever >38.5 or hypotensive  ##Leukocytosis: WBC 12.6 with left shift on admission. Likely 2/2 to active Crohn's. Currently afebrile.  -Consider antibiotic therapy if spike fever  -Blood culture if spike fever >38.5 -Monitor daily CBC  ##Infectious Disease: LFT normal, no concern for hepatitis. HIV non-reactive  ##DVT PPx: -Lovenox 40 SQ daily   ##Dispostion: Dispostion deferred at this time, awaiting improvement of symptoms. Anticipated discharge in 1-2 days.    This is a Careers information officer Note.  The care of the patient was discussed with Dr. Daryll Drown and the assessment and plan formulated with their assistance.  Please see their attached note for official documentation of the daily encounter.   LOS: 1 day   Florentina Addison, Med Student 11/20/2014, 6:39 AM \

## 2014-11-20 NOTE — Consult Note (Signed)
[facility] CONS[facility] Reason for consult: abnormal CT scan suggesting Crohn's disease Referring Physician:   [name] is an 49 y.o. male.  HPI: he was admitted after several months of abdominal pain.Marland Kitchen He has lost weight. He is not had diarrhea or dysuria. CT scan revealed extensive Crohn's disease of the terminal ileum and cecum with 2 small abscess season the mesentery. Were asked to see him to confirm diagnosis of Crohn's disease. The patient denies any prior knowledge of Crohn's disease. His only home medications have been ibuprofen and Naprosyn which she obtained OTC WBC was slightly elevated at 12.6  Past Medical History  Diagnosis Date  . Hypoglycemia     History reviewed. No pertinent past surgical history.  No family history on file.  Social History:  reports that he has never smoked. He does not have any smokeless tobacco history on file. He reports that he drinks alcohol. He reports that he does not use illicit drugs.  Allergies:  Allergies  Allergen Reactions  . Tylenol [Acetaminophen] Shortness Of Breath    Shaky, heart racing    Medications; Prior to Admission medications   Medication Sig Start Date End Date Taking? Authorizing Provider  ibuprofen (ADVIL,MOTRIN) 200 MG tablet Take 400 mg by mouth every 6 (six) hours as needed for moderate pain.   Yes Historical Provider, MD  naproxen (NAPROSYN) 500 MG tablet Take 1 tablet (500 mg total) by mouth 2 (two) times daily. Patient not taking: Reported on 11/19/2014 07/20/14   Barry Hams, MD   . ciprofloxacin  500 mg Oral BID  . enoxaparin (LOVENOX) injection  40 mg Subcutaneous Q24H  . metroNIDAZOLE  500 mg Oral 3 times per day  . polyethylene glycol-electrolytes  4,000 mL Oral Once  . sulfaSALAzine  1,000 mg Oral TID   PRN Meds morphine injection, ondansetron **OR** ondansetron (ZOFRAN) IV Results for orders placed or performed during the hospital encounter of 11/19/14 (from the past 48 hour(s))   Lipase, blood     Status: Abnormal   Collection Time: 11/19/14 11:00 AM  Result Value Ref Range   Lipase 19 (L) 22 - 51 U/L  Comprehensive metabolic panel     Status: Abnormal   Collection Time: 11/19/14 11:00 AM  Result Value Ref Range   Sodium 140 135 - 145 mmol/L   Potassium 3.9 3.5 - 5.1 mmol/L   Chloride 104 101 - 111 mmol/L   CO2 28 22 - 32 mmol/L   Glucose, Bld 114 (H) 65 - 99 mg/dL   BUN 7 6 - 20 mg/dL   Creatinine, Ser 0.93 0.61 - 1.24 mg/dL   Calcium 9.3 8.9 - 10.3 mg/dL   Total Protein 7.0 6.5 - 8.1 g/dL   Albumin 3.2 (L) 3.5 - 5.0 g/dL   AST 22 15 - 41 U/L   ALT 25 17 - 63 U/L   Alkaline Phosphatase 71 38 - 126 U/L   Total Bilirubin 0.3 0.3 - 1.2 mg/dL   GFR calc non Af Amer >60 >60 mL/min   GFR calc Af Amer >60 >60 mL/min    Comment: (NOTE) The eGFR has been calculated using the CKD EPI equation. This calculation has not been validated in all clinical situations. eGFR's persistently <60 mL/min signify possible Chronic Kidney Disease.    Anion gap 8 5 - 15  CBC     Status: Abnormal   Collection Time: 11/19/14 11:00 AM  Result Value Ref Range   WBC 12.6 (H) 4.0 - 10.5 K/uL  RBC 5.14 4.22 - 5.81 MIL/uL   Hemoglobin 13.2 13.0 - 17.0 g/dL   HCT 40.5 39.0 - 52.0 %   MCV 78.8 78.0 - 100.0 fL   MCH 25.7 (L) 26.0 - 34.0 pg   MCHC 32.6 30.0 - 36.0 g/dL   RDW 13.9 11.5 - 15.5 %   Platelets 436 (H) 150 - 400 K/uL  Differential     Status: Abnormal   Collection Time: 11/19/14 11:00 AM  Result Value Ref Range   Neutrophils Relative % 80 %   Neutro Abs 10.1 (H) 1.7 - 7.7 K/uL   Lymphocytes Relative 8 %   Lymphs Abs 1.0 0.7 - 4.0 K/uL   Monocytes Relative 8 %   Monocytes Absolute 1.0 0.1 - 1.0 K/uL   Eosinophils Relative 3 %   Eosinophils Absolute 0.4 0.0 - 0.7 K/uL   Basophils Relative 1 %   Basophils Absolute 0.1 0.0 - 0.1 K/uL  Urinalysis, Routine w reflex microscopic (not at [facility])     Status: None   Collection Time: 11/19/14 11:55 AM  Result Value Ref  Range   Color, Urine YELLOW YELLOW   APPearance CLEAR CLEAR   Specific Gravity, Urine 1.020 1.005 - 1.030   pH 7.0 5.0 - 8.0   Glucose, UA NEGATIVE NEGATIVE mg/dL   Hgb urine dipstick NEGATIVE NEGATIVE   Bilirubin Urine NEGATIVE NEGATIVE   Ketones, ur NEGATIVE NEGATIVE mg/dL   Protein, ur NEGATIVE NEGATIVE mg/dL   Urobilinogen, UA 1.0 0.0 - 1.0 mg/dL   Nitrite NEGATIVE NEGATIVE   Leukocytes, UA NEGATIVE NEGATIVE    Comment: MICROSCOPIC NOT DONE ON URINES WITH NEGATIVE PROTEIN, BLOOD, LEUKOCYTES, NITRITE, OR GLUCOSE <1000 mg/dL.  HIV antibody     Status: None   Collection Time: 11/19/14  7:15 PM  Result Value Ref Range   HIV Screen 4th Generation wRfx Non Reactive Non Reactive    Comment: (NOTE) Performed At: [facility] [place], [place] [id number] Lindon Romp MD [id number]   Sedimentation rate     Status: Abnormal   Collection Time: 11/19/14  7:15 PM  Result Value Ref Range   Sed Rate 41 (H) 0 - 16 mm/hr  C-reactive protein     Status: Abnormal   Collection Time: 11/19/14  7:15 PM  Result Value Ref Range   CRP 10.8 (H) <1.0 mg/dL  Vitamin B12     Status: None   Collection Time: 11/19/14  7:15 PM  Result Value Ref Range   Vitamin B-12 307 180 - 914 pg/mL    Comment: (NOTE) This assay is not validated for testing neonatal or myeloproliferative syndrome specimens for Vitamin B12 levels.   Iron and TIBC     Status: Abnormal   Collection Time: 11/19/14  7:15 PM  Result Value Ref Range   Iron 22 (L) 45 - 182 ug/dL   TIBC 293 250 - 450 ug/dL   Saturation Ratios 8 (L) 17.9 - 39.5 %   UIBC 271 ug/dL  Ferritin     Status: None   Collection Time: 11/19/14  7:15 PM  Result Value Ref Range   Ferritin 142 24 - 336 ng/mL  Folate     Status: None   Collection Time: 11/19/14  7:15 PM  Result Value Ref Range   Folate 27.0 >5.9 ng/mL  Hemoglobin A1c     Status: Abnormal   Collection Time: 11/19/14  7:25 PM  Result Value Ref Range   Hgb A1c MFr Bld  6.6 (H)  4.8 - 5.6 %    Comment: (NOTE)         Pre-diabetes: 5.7 - 6.4         Diabetes: >6.4         Glycemic control for adults with diabetes: <7.0    Mean Plasma Glucose 143 mg/dL    Comment: (NOTE) Performed At: [facility] [place], [place] [id number] Lindon Romp MD [id number]   Occult blood card to lab, stool RN will collect     Status: None   Collection Time: 11/19/14 11:05 PM  Result Value Ref Range   Fecal Occult Bld NEGATIVE NEGATIVE  CBC     Status: Abnormal   Collection Time: 11/20/14  5:30 AM  Result Value Ref Range   WBC 11.7 (H) 4.0 - 10.5 K/uL   RBC 5.09 4.22 - 5.81 MIL/uL   Hemoglobin 13.1 13.0 - 17.0 g/dL   HCT 40.3 39.0 - 52.0 %   MCV 79.2 78.0 - 100.0 fL   MCH 25.7 (L) 26.0 - 34.0 pg   MCHC 32.5 30.0 - 36.0 g/dL   RDW 14.2 11.5 - 15.5 %   Platelets 446 (H) 150 - 400 K/uL  Basic metabolic panel     Status: Abnormal   Collection Time: 11/20/14  5:30 AM  Result Value Ref Range   Sodium 135 135 - 145 mmol/L   Potassium 4.1 3.5 - 5.1 mmol/L   Chloride 99 (L) 101 - 111 mmol/L   CO2 27 22 - 32 mmol/L   Glucose, Bld 98 65 - 99 mg/dL   BUN 8 6 - 20 mg/dL   Creatinine, Ser 0.95 0.61 - 1.24 mg/dL   Calcium 8.8 (L) 8.9 - 10.3 mg/dL   GFR calc non Af Amer >60 >60 mL/min   GFR calc Af Amer >60 >60 mL/min    Comment: (NOTE) The eGFR has been calculated using the CKD EPI equation. This calculation has not been validated in all clinical situations. eGFR's persistently <60 mL/min signify possible Chronic Kidney Disease.    Anion gap 9 5 - 15    Ct Abdomen Pelvis W Contrast  11/19/2014   CLINICAL DATA:  Right lower quadrant pain for 3-4 months. Night sweats.  EXAM: CT ABDOMEN AND PELVIS WITH CONTRAST  TECHNIQUE: Multidetector CT imaging of the abdomen and pelvis was performed using the standard protocol following bolus administration of intravenous contrast.  CONTRAST:  124m OMNIPAQUE IOHEXOL 300 MG/ML  SOLN  COMPARISON:  None.   FINDINGS: Lower chest:  Normal.  Hepatobiliary: Normal.  Pancreas: Normal.  Spleen: Normal.  Adrenals/Urinary Tract: Normal adrenal glands. Normal kidneys, ureters, and bladder.  Stomach/Bowel: There is mucosal thickening of multiple loops of the distal ileum including the terminal ileum. There is marked inflammation in the mesenteries adjacent to the ileocecal valve with two tiny areas which may represent abscesses in the mesenteric. No free air or free fluid. The appendix is immediately adjacent to the area of inflammation and there is small area of mucosal thickening in the mid appendix which is felt to be due to the adjacent ileitis and not due to appendicitis. There is slight thickening of the mucosa of the cecum. The remainder of the bowel appears normal.  Vascular/Lymphatic: The vessels are normal. Multiple small reactive nodes in the mesenteries of the bowel.  Reproductive: Small left inguinal hernia containing only fat. Otherwise normal.  Musculoskeletal: No significant abnormality.  IMPRESSION: Findings consistent with extensive Crohn's disease of the distal ileum and  cecum with focal inflammation in the mesentery adjacent at the ileocecal valve with 2 tiny abscesses in the mesentery each measuring less than 1 cm in size.   Electronically Signed   By: Lorriane Shire M.D.   On: 11/19/2014 15:36               Blood pressure 108/80, pulse 89, temperature 97.4 F (36.3 C), temperature source Oral, resp. rate 18, height 5' 11"  (1.803 m), weight 87.998 kg (194 lb), SpO2 97 %.  Physical exam:   General-- Hispanic male in no acute distress, speaks [place] fairly well ENT-- normal Neck-- no lymphadenopathy Heart-- regular rate and rhythm without murmurs or gallops Lungs-- clear Abdomen-- nondistended with good bowel sounds mild tenderness in the right lower quadrant Psych-- alert and oriented, appropriate   Assessment: 1. Abnormal CT scan showing ileitis and changes consistent with  Crohn's disease of the cecum and terminal ileum. He has been a lot of nonsteroidal medications so it is possible this could be related to that. I think a tissue diagnosis is made.  Plan: 1. We will proceed with colonoscopy this admission. Have discussed with patient and family they understand colonoscopy. Pending the results of this he may need to go on a course of steroids.   Layna Roeper JR,Quadir Muns L 11/20/2014, 10:46 AM   Pager: [phone] If no answer or after hours call [phone]

## 2014-11-20 NOTE — Progress Notes (Signed)
Subjective:  Patient was seen and examined this morning. He states abdominal pain is better controlled with pain medication. Pain is 6/10. No nausea, vomiting, diarrhea, fever or chills.   Objective: Filed Vitals:   11/19/14 1715 11/19/14 1805 11/19/14 2230 11/20/14 0452  BP: 128/85 133/80 124/77 108/80  Pulse:  91 97 89  Temp:  98.6 F (37 C) 98.5 F (36.9 C) 97.4 F (36.3 C)  TempSrc:  Oral Oral Oral  Resp:  16 18 18   Height:      Weight:      SpO2: 98% 94% 98% 97%   General: Vital signs reviewed.  Patient is well-developed and well-nourished, in no acute distress and cooperative with exam.  Cardiovascular: RRR, S1 normal, S2 normal, no murmurs, gallops, or rubs. Pulmonary/Chest: Clear to auscultation bilaterally, no wheezes, rales, or rhonchi. Abdominal: Soft, tender to palpation diffusely, but worst in RLQ, non-distended, BS +, no masses, organomegaly, or guarding present.  Extremities: No lower extremity edema bilaterally Skin: Warm, dry and intact. No rashes or erythema.  Lab Results: Basic Metabolic Panel:  Recent Labs Lab 11/19/14 1100 11/20/14 0530  NA 140 135  K 3.9 4.1  CL 104 99*  CO2 28 27  GLUCOSE 114* 98  BUN 7 8  CREATININE 0.93 0.95  CALCIUM 9.3 8.8*   Liver Function Tests:  Recent Labs Lab 11/19/14 1100  AST 22  ALT 25  ALKPHOS 71  BILITOT 0.3  PROT 7.0  ALBUMIN 3.2*    Recent Labs Lab 11/19/14 1100  LIPASE 19*   CBC:  Recent Labs Lab 11/19/14 1100 11/20/14 0530  WBC 12.6* 11.7*  NEUTROABS 10.1*  --   HGB 13.2 13.1  HCT 40.5 40.3  MCV 78.8 79.2  PLT 436* 446*   Hemoglobin A1C:  Recent Labs Lab 11/19/14 1925  HGBA1C 6.6*   Anemia Panel:  Recent Labs Lab 11/19/14 1915  VITAMINB12 307  FOLATE 27.0  FERRITIN 142  TIBC 293  IRON 22*   Urinalysis:  Recent Labs Lab 11/19/14 1155  COLORURINE YELLOW  LABSPEC 1.020  PHURINE 7.0  GLUCOSEU NEGATIVE  HGBUR NEGATIVE  BILIRUBINUR NEGATIVE  KETONESUR NEGATIVE    PROTEINUR NEGATIVE  UROBILINOGEN 1.0  NITRITE NEGATIVE  LEUKOCYTESUR NEGATIVE   Studies/Results: Ct Abdomen Pelvis W Contrast  11/19/2014   CLINICAL DATA:  Right lower quadrant pain for 3-4 months. Night sweats.  EXAM: CT ABDOMEN AND PELVIS WITH CONTRAST  TECHNIQUE: Multidetector CT imaging of the abdomen and pelvis was performed using the standard protocol following bolus administration of intravenous contrast.  CONTRAST:  180m OMNIPAQUE IOHEXOL 300 MG/ML  SOLN  COMPARISON:  None.  FINDINGS: Lower chest:  Normal.  Hepatobiliary: Normal.  Pancreas: Normal.  Spleen: Normal.  Adrenals/Urinary Tract: Normal adrenal glands. Normal kidneys, ureters, and bladder.  Stomach/Bowel: There is mucosal thickening of multiple loops of the distal ileum including the terminal ileum. There is marked inflammation in the mesenteries adjacent to the ileocecal valve with two tiny areas which may represent abscesses in the mesenteric. No free air or free fluid. The appendix is immediately adjacent to the area of inflammation and there is small area of mucosal thickening in the mid appendix which is felt to be due to the adjacent ileitis and not due to appendicitis. There is slight thickening of the mucosa of the cecum. The remainder of the bowel appears normal.  Vascular/Lymphatic: The vessels are normal. Multiple small reactive nodes in the mesenteries of the bowel.  Reproductive: Small left inguinal hernia  containing only fat. Otherwise normal.  Musculoskeletal: No significant abnormality.  IMPRESSION: Findings consistent with extensive Crohn's disease of the distal ileum and cecum with focal inflammation in the mesentery adjacent at the ileocecal valve with 2 tiny abscesses in the mesentery each measuring less than 1 cm in size.   Electronically Signed   By: Lorriane Shire M.D.   On: 11/19/2014 15:36   Medications:  I have reviewed the patient's current medications. Prior to Admission:  Prescriptions prior to admission   Medication Sig Dispense Refill Last Dose  . ibuprofen (ADVIL,MOTRIN) 200 MG tablet Take 400 mg by mouth every 6 (six) hours as needed for moderate pain.   11/18/2014 at Unknown time  . naproxen (NAPROSYN) 500 MG tablet Take 1 tablet (500 mg total) by mouth 2 (two) times daily. (Patient not taking: Reported on 11/19/2014) 30 tablet 0    Scheduled: . ciprofloxacin  500 mg Oral BID  . enoxaparin (LOVENOX) injection  40 mg Subcutaneous Q24H  . metroNIDAZOLE  500 mg Oral 3 times per day  . polyethylene glycol-electrolytes  4,000 mL Oral Once  . sulfaSALAzine  1,000 mg Oral TID   Scheduled Meds: . ciprofloxacin  500 mg Oral BID  . enoxaparin (LOVENOX) injection  40 mg Subcutaneous Q24H  . metroNIDAZOLE  500 mg Oral 3 times per day  . polyethylene glycol-electrolytes  4,000 mL Oral Once  . sulfaSALAzine  1,000 mg Oral TID   Continuous Infusions:  PRN Meds:.morphine injection, ondansetron **OR** ondansetron (ZOFRAN) IV Assessment/Plan: Active Problems:   Intra-abdominal abscess (HCC)   Crohn's colitis (HCC)   Leukocytosis   Crohn disease (HCC)   Crohn's disease of intestine with abscess (HCC)  Ileocolitis with Abscess likely 2/2 Crohn's Disease: Abdominal pain has minimally improved. No N/V/D. ESR and CRP elevated, FOBT negative, vitamin B12 normal, iron low, TIBC normal, ferritin normal, saturation low, folate normal. Patient is tolerating sulfasalazine 1 gram TID. We will start Cipro/Flagyl for two small < 1 cm mesenteric abscesses. GI has been consulted for assistance with management and possible colonoscopy/endoscopy with biopsy for diagnosis. These tests may be done as outpatient possibly.  -Regular diet -GI consult, appreciate recommendations -Sulfasalazine 1000 mg TID -Ciprofloxacin 500 mg po BID -Flagyl 500 mg po Q8H -Morphine 2 mg IV Q4H prn pain -Avoid NSAIDs -CBC tomorrow am -BMET tomorrow am -Vitamin D pending  DVT/PE ppx: Lovenox SQ QD  Dispo: Disposition is deferred at  this time, awaiting improvement of current medical problems.  Anticipated discharge in approximately 1-2 day(s).   The patient does not have a current PCP (No primary care provider on file.) and does need an [facility] hospital follow-up appointment after discharge.  The patient does not have transportation limitations that hinder transportation to clinic appointments.  .Services Needed at time of discharge: Y = Yes, Blank = No PT:   OT:   RN:   Equipment:   Other:     LOS: 1 day   Osa Craver, DO PGY-1 Internal Medicine Resident Pager # [phone] 11/20/2014 11:01 AM

## 2014-11-21 ENCOUNTER — Observation Stay (HOSPITAL_COMMUNITY): Payer: Self-pay | Admitting: Anesthesiology

## 2014-11-21 ENCOUNTER — Encounter (HOSPITAL_COMMUNITY): Payer: Self-pay | Admitting: *Deleted

## 2014-11-21 ENCOUNTER — Encounter (HOSPITAL_COMMUNITY)
Admission: EM | Disposition: A | Discharge status: Home / Self Care | Payer: Self-pay | Source: Home / Self Care | Attending: Emergency Medicine

## 2014-11-21 DIAGNOSIS — D72829 Elevated white blood cell count, unspecified: Secondary | ICD-10-CM

## 2014-11-21 HISTORY — PX: COLONOSCOPY: SHX5424

## 2014-11-21 LAB — BASIC METABOLIC PANEL
Anion gap: 10 (ref 5–15)
BUN: 8 mg/dL (ref 6–20)
CO2: 25 mmol/L (ref 22–32)
CREATININE: 0.93 mg/dL (ref 0.61–1.24)
Calcium: 9 mg/dL (ref 8.9–10.3)
Chloride: 102 mmol/L (ref 101–111)
GFR calc Af Amer: >60 mL/min (ref >60)
Glucose, Bld: 89 mg/dL (ref 65–99)
Potassium: 4.2 mmol/L (ref 3.5–5.1)
SODIUM: 137 mmol/L (ref 135–145)

## 2014-11-21 LAB — CBC
HCT: 40.9 % (ref 39.0–52.0)
Hemoglobin: 13.2 g/dL (ref 13.0–17.0)
MCH: 25.6 pg — AB (ref 26.0–34.0)
MCHC: 32.3 g/dL (ref 30.0–36.0)
MCV: 79.3 fL (ref 78.0–100.0)
PLATELETS: 420 10*3/uL — AB (ref 150–400)
RBC: 5.16 MIL/uL (ref 4.22–5.81)
RDW: 14.2 % (ref 11.5–15.5)
WBC: 10.5 10*3/uL (ref 4.0–10.5)

## 2014-11-21 SURGERY — COLONOSCOPY
Anesthesia: Monitor Anesthesia Care

## 2014-11-21 MED ORDER — LACTATED RINGERS IV SOLN
INTRAVENOUS | Status: DC | PRN
  Administered 2014-11-21: 13:00:00 via INTRAVENOUS

## 2014-11-21 MED ORDER — METRONIDAZOLE 500 MG PO TABS: 500.0000 mg | ORAL_TABLET | Freq: Three times a day (TID) | ORAL | Status: AC

## 2014-11-21 MED ORDER — PROPOFOL 10 MG/ML IV BOLUS
INTRAVENOUS | Status: DC | PRN
  Administered 2014-11-21: 40 mg via INTRAVENOUS
  Administered 2014-11-21: 60 mg via INTRAVENOUS
  Administered 2014-11-21: 80 mg via INTRAVENOUS
  Administered 2014-11-21 (×2): 40 mg via INTRAVENOUS
  Administered 2014-11-21: 30 mg via INTRAVENOUS
  Administered 2014-11-21: 40 mg via INTRAVENOUS
  Administered 2014-11-21: 30 mg via INTRAVENOUS
  Administered 2014-11-21: 100 mg via INTRAVENOUS

## 2014-11-21 MED ORDER — LIDOCAINE HCL (CARDIAC) 20 MG/ML IV SOLN
INTRAVENOUS | Status: DC | PRN
  Administered 2014-11-21: 50 mg via INTRAVENOUS

## 2014-11-21 MED ORDER — PREDNISONE 20 MG PO TABS: 20.0000 mg | ORAL_TABLET | Freq: Every day | ORAL | Status: DC

## 2014-11-21 MED ORDER — CIPROFLOXACIN HCL 500 MG PO TABS: 500.0000 mg | ORAL_TABLET | Freq: Two times a day (BID) | ORAL | Status: DC

## 2014-11-21 MED ORDER — METFORMIN HCL 500 MG PO TABS: 500.0000 mg | ORAL_TABLET | Freq: Every day | ORAL | Status: AC

## 2014-11-21 MED ORDER — SULFASALAZINE 500 MG PO TBEC: 1000.0000 mg | DELAYED_RELEASE_TABLET | Freq: Three times a day (TID) | ORAL | Status: DC

## 2014-11-21 NOTE — Anesthesia Postprocedure Evaluation (Signed)
Anesthesia Post Note  Patient: Man Mom  Procedure(s) Performed: Procedure(s) (LRB): COLONOSCOPY (N/A)  Anesthesia type: MAC  Patient location: PACU  Post pain: Pain level controlled  Post assessment: Patient's Cardiovascular Status Stable  Last Vitals:  Filed Vitals:   11/21/14 1502  BP: 111/73  Pulse: 74  Temp: 36.3 C  Resp: 20    Post vital signs: Reviewed and stable  Level of consciousness: sedated  Complications: No apparent anesthesia complications

## 2014-11-21 NOTE — Anesthesia Procedure Notes (Signed)
Procedure Name: MAC Date/Time: 11/21/2014 1:43 PM Performed by: Carmela Rima Pre-anesthesia Checklist: Timeout performed, Patient being monitored, Suction available, Emergency Drugs available and Patient identified Oxygen Delivery Method: Simple face mask Placement Confirmation: positive ETCO2 Dental Injury: Teeth and Oropharynx as per pre-operative assessment

## 2014-11-21 NOTE — Progress Notes (Signed)
AVS discharge instructions was reviewed with patient. Patient was told that he had prescriptions to pickup at his pharmacy [facility]. Patient stated that he did not have any questions. Patient ambulated to his transportation with his wife and son.

## 2014-11-21 NOTE — Progress Notes (Signed)
Subjective: No acute overnight events  Saw patient this morning. He states that his abdominal pain in his RLQ is still present but has improved slightly.  He has been taking go lightly and has had 3-4 stools this morning.  Patient denies nausea, vomiting, diarrhea, chest pain, SOB, and headache.     Objective:  Temp (24hrs), Avg:97.9 F (36.6 C), Min:97.8 F (36.6 C), Max:98.2 F (36.8 C)    Vital signs in last 24 hours: Filed Vitals:   11/20/14 0452 11/20/14 1420 11/20/14 2129 11/21/14 0554  BP: 108/80 116/78 120/80 117/80  Pulse: 89 80 85 88  Temp: 97.4 F (36.3 C) 97.8 F (36.6 C) 98.2 F (36.8 C) 97.8 F (36.6 C)  TempSrc: Oral Oral Oral Oral  Resp: _0 Height:      Weight:      SpO2: 97% 99% 98% 99%   Weight change:   Intake/Output Summary (Last 24 hours) at 11/21/14 0703 Last data filed at 11/20/14 1802  Gross per 24 hour  Intake   1580 ml  Output      0 ml  Net   1580 ml   Physical Exam  Constitutional: He appears well-developed and well-nourished. No distress.  HENT:  Head: Normocephalic and atraumatic.  Right Ear: External ear normal.  Left Ear: External ear normal.  Eyes: EOM are normal. Right eye exhibits no discharge. Left eye exhibits no discharge. No scleral icterus.  Neck: Normal range of motion.  Cardiovascular: Normal rate.  Exam reveals no gallop and no friction rub.   No murmur heard. Pulmonary/Chest: Effort normal and breath sounds normal. He has no wheezes.  Abdominal: Soft. Bowel sounds are normal. There is tenderness (upon palpation of the RLQ ). There is guarding. There is no rebound.  Musculoskeletal: Normal range of motion. He exhibits no edema.  Neurological: He is alert.  Skin: Skin is warm and dry.  Psychiatric: He has a normal mood and affect.    Lab Results: Results for orders placed or performed during the hospital encounter of 11/19/14 (from the past 48 hour(s))  Lipase, blood     Status: Abnormal   Collection  Time: 11/19/14 11:00 AM  Result Value Ref Range   Lipase 19 (L) 22 - 51 U/L  Comprehensive metabolic panel     Status: Abnormal   Collection Time: 11/19/14 11:00 AM  Result Value Ref Range   Sodium 140 135 - 145 mmol/L   Potassium 3.9 3.5 - 5.1 mmol/L   Chloride 104 101 - 111 mmol/L   CO2 28 22 - 32 mmol/L   Glucose, Bld 114 (H) 65 - 99 mg/dL   BUN 7 6 - 20 mg/dL   Creatinine, Ser 0.93 0.61 - 1.24 mg/dL   Calcium 9.3 8.9 - 10.3 mg/dL   Total Protein 7.0 6.5 - 8.1 g/dL   Albumin 3.2 (L) 3.5 - 5.0 g/dL   AST 22 15 - 41 U/L   ALT 25 17 - 63 U/L   Alkaline Phosphatase 71 38 - 126 U/L   Total Bilirubin 0.3 0.3 - 1.2 mg/dL   GFR calc non Af Amer >60 >60 mL/min   GFR calc Af Amer >60 >60 mL/min    Comment: (NOTE) The eGFR has been calculated using the CKD EPI equation. This calculation has not been validated in all clinical situations. eGFR's persistently <60 mL/min signify possible Chronic Kidney Disease.    Anion gap 8 5 - 15  CBC     Status:  Abnormal   Collection Time: 11/19/14 11:00 AM  Result Value Ref Range   WBC 12.6 (H) 4.0 - 10.5 K/uL   RBC 5.14 4.22 - 5.81 MIL/uL   Hemoglobin 13.2 13.0 - 17.0 g/dL   HCT 40.5 39.0 - 52.0 %   MCV 78.8 78.0 - 100.0 fL   MCH 25.7 (L) 26.0 - 34.0 pg   MCHC 32.6 30.0 - 36.0 g/dL   RDW 13.9 11.5 - 15.5 %   Platelets 436 (H) 150 - 400 K/uL  Differential     Status: Abnormal   Collection Time: 11/19/14 11:00 AM  Result Value Ref Range   Neutrophils Relative % 80 %   Neutro Abs 10.1 (H) 1.7 - 7.7 K/uL   Lymphocytes Relative 8 %   Lymphs Abs 1.0 0.7 - 4.0 K/uL   Monocytes Relative 8 %   Monocytes Absolute 1.0 0.1 - 1.0 K/uL   Eosinophils Relative 3 %   Eosinophils Absolute 0.4 0.0 - 0.7 K/uL   Basophils Relative 1 %   Basophils Absolute 0.1 0.0 - 0.1 K/uL  Urinalysis, Routine w reflex microscopic (not at [facility])     Status: None   Collection Time: 11/19/14 11:55 AM  Result Value Ref Range   Color, Urine YELLOW YELLOW   APPearance CLEAR  CLEAR   Specific Gravity, Urine 1.020 1.005 - 1.030   pH 7.0 5.0 - 8.0   Glucose, UA NEGATIVE NEGATIVE mg/dL   Hgb urine dipstick NEGATIVE NEGATIVE   Bilirubin Urine NEGATIVE NEGATIVE   Ketones, ur NEGATIVE NEGATIVE mg/dL   Protein, ur NEGATIVE NEGATIVE mg/dL   Urobilinogen, UA 1.0 0.0 - 1.0 mg/dL   Nitrite NEGATIVE NEGATIVE   Leukocytes, UA NEGATIVE NEGATIVE    Comment: MICROSCOPIC NOT DONE ON URINES WITH NEGATIVE PROTEIN, BLOOD, LEUKOCYTES, NITRITE, OR GLUCOSE <1000 mg/dL.  HIV antibody     Status: None   Collection Time: 11/19/14  7:15 PM  Result Value Ref Range   HIV Screen 4th Generation wRfx Non Reactive Non Reactive    Comment: (NOTE) Performed At: [facility] [place], [place] [id number] Lindon Romp MD [id number]   Sedimentation rate     Status: Abnormal   Collection Time: 11/19/14  7:15 PM  Result Value Ref Range   Sed Rate 41 (H) 0 - 16 mm/hr  C-reactive protein     Status: Abnormal   Collection Time: 11/19/14  7:15 PM  Result Value Ref Range   CRP 10.8 (H) <1.0 mg/dL  Vitamin B12     Status: None   Collection Time: 11/19/14  7:15 PM  Result Value Ref Range   Vitamin B-12 307 180 - 914 pg/mL    Comment: (NOTE) This assay is not validated for testing neonatal or myeloproliferative syndrome specimens for Vitamin B12 levels.   Iron and TIBC     Status: Abnormal   Collection Time: 11/19/14  7:15 PM  Result Value Ref Range   Iron 22 (L) 45 - 182 ug/dL   TIBC 293 250 - 450 ug/dL   Saturation Ratios 8 (L) 17.9 - 39.5 %   UIBC 271 ug/dL  Ferritin     Status: None   Collection Time: 11/19/14  7:15 PM  Result Value Ref Range   Ferritin 142 24 - 336 ng/mL  Folate     Status: None   Collection Time: 11/19/14  7:15 PM  Result Value Ref Range   Folate 27.0 >5.9 ng/mL  Hemoglobin A1c  Status: Abnormal   Collection Time: 11/19/14  7:25 PM  Result Value Ref Range   Hgb A1c MFr Bld 6.6 (H) 4.8 - 5.6 %    Comment: (NOTE)          Pre-diabetes: 5.7 - 6.4         Diabetes: >6.4         Glycemic control for adults with diabetes: <7.0    Mean Plasma Glucose 143 mg/dL    Comment: (NOTE) Performed At: [facility] [place], [place] [id number] Lindon Romp MD BJ:[id number]   Occult blood card to lab, stool RN will collect     Status: None   Collection Time: 11/19/14 11:05 PM  Result Value Ref Range   Fecal Occult Bld NEGATIVE NEGATIVE  CBC     Status: Abnormal   Collection Time: 11/20/14  5:30 AM  Result Value Ref Range   WBC 11.7 (H) 4.0 - 10.5 K/uL   RBC 5.09 4.22 - 5.81 MIL/uL   Hemoglobin 13.1 13.0 - 17.0 g/dL   HCT 40.3 39.0 - 52.0 %   MCV 79.2 78.0 - 100.0 fL   MCH 25.7 (L) 26.0 - 34.0 pg   MCHC 32.5 30.0 - 36.0 g/dL   RDW 14.2 11.5 - 15.5 %   Platelets 446 (H) 150 - 400 K/uL  Basic metabolic panel     Status: Abnormal   Collection Time: 11/20/14  5:30 AM  Result Value Ref Range   Sodium 135 135 - 145 mmol/L   Potassium 4.1 3.5 - 5.1 mmol/L   Chloride 99 (L) 101 - 111 mmol/L   CO2 27 22 - 32 mmol/L   Glucose, Bld 98 65 - 99 mg/dL   BUN 8 6 - 20 mg/dL   Creatinine, Ser 0.95 0.61 - 1.24 mg/dL   Calcium 8.8 (L) 8.9 - 10.3 mg/dL   GFR calc non Af Amer >60 >60 mL/min   GFR calc Af Amer >60 >60 mL/min    Comment: (NOTE) The eGFR has been calculated using the CKD EPI equation. This calculation has not been validated in all clinical situations. eGFR's persistently <60 mL/min signify possible Chronic Kidney Disease.    Anion gap 9 5 - 15  CBC     Status: Abnormal   Collection Time: 11/21/14  4:55 AM  Result Value Ref Range   WBC 10.5 4.0 - 10.5 K/uL   RBC 5.16 4.22 - 5.81 MIL/uL   Hemoglobin 13.2 13.0 - 17.0 g/dL   HCT 40.9 39.0 - 52.0 %   MCV 79.3 78.0 - 100.0 fL   MCH 25.6 (L) 26.0 - 34.0 pg   MCHC 32.3 30.0 - 36.0 g/dL   RDW 14.2 11.5 - 15.5 %   Platelets 420 (H) 150 - 400 K/uL  Basic metabolic panel     Status: None   Collection Time: 11/21/14  4:55 AM  Result  Value Ref Range   Sodium 137 135 - 145 mmol/L   Potassium 4.2 3.5 - 5.1 mmol/L   Chloride 102 101 - 111 mmol/L   CO2 25 22 - 32 mmol/L   Glucose, Bld 89 65 - 99 mg/dL   BUN 8 6 - 20 mg/dL   Creatinine, Ser 0.93 0.61 - 1.24 mg/dL   Calcium 9.0 8.9 - 10.3 mg/dL   GFR calc non Af Amer >60 >60 mL/min   GFR calc Af Amer >60 >60 mL/min    Comment: (NOTE) The eGFR has been calculated using the  CKD EPI equation. This calculation has not been validated in all clinical situations. eGFR's persistently <60 mL/min signify possible Chronic Kidney Disease.    Anion gap 10 5 - 15   Micro Results: No results found for this or any previous visit (from the past 240 hour(s)). Studies/Results: Ct Abdomen Pelvis W Contrast  11/19/2014   CLINICAL DATA:  Right lower quadrant pain for 3-4 months. Night sweats.  EXAM: CT ABDOMEN AND PELVIS WITH CONTRAST  TECHNIQUE: Multidetector CT imaging of the abdomen and pelvis was performed using the standard protocol following bolus administration of intravenous contrast.  CONTRAST:  146m OMNIPAQUE IOHEXOL 300 MG/ML  SOLN  COMPARISON:  None.  FINDINGS: Lower chest:  Normal.  Hepatobiliary: Normal.  Pancreas: Normal.  Spleen: Normal.  Adrenals/Urinary Tract: Normal adrenal glands. Normal kidneys, ureters, and bladder.  Stomach/Bowel: There is mucosal thickening of multiple loops of the distal ileum including the terminal ileum. There is marked inflammation in the mesenteries adjacent to the ileocecal valve with two tiny areas which may represent abscesses in the mesenteric. No free air or free fluid. The appendix is immediately adjacent to the area of inflammation and there is small area of mucosal thickening in the mid appendix which is felt to be due to the adjacent ileitis and not due to appendicitis. There is slight thickening of the mucosa of the cecum. The remainder of the bowel appears normal.  Vascular/Lymphatic: The vessels are normal. Multiple small reactive nodes in  the mesenteries of the bowel.  Reproductive: Small left inguinal hernia containing only fat. Otherwise normal.  Musculoskeletal: No significant abnormality.  IMPRESSION: Findings consistent with extensive Crohn's disease of the distal ileum and cecum with focal inflammation in the mesentery adjacent at the ileocecal valve with 2 tiny abscesses in the mesentery each measuring less than 1 cm in size.   Electronically Signed   By: JLorriane ShireM.D.   On: 11/19/2014 15:36   Medications: I have reviewed the patient's current medications. Scheduled Meds: . ciprofloxacin  500 mg Oral BID  . enoxaparin (LOVENOX) injection  40 mg Subcutaneous Q24H  . metroNIDAZOLE  500 mg Oral 3 times per day  . sulfaSALAzine  1,000 mg Oral TID   Continuous Infusions:  PRN Meds:.morphine injection, ondansetron **OR** ondansetron (ZOFRAN) IV Assessment/Plan: Active Problems:   Intra-abdominal abscess (HCC)   [place]'s colitis ([place]   Leukocytosis   [place]   Cro[place]'s disease of intestine with abscess ([place]  Summary: JLoraine Freidis a 49y.o. male who presents with a 3 month history of RLQ abdominal tenderness In the context of a 15 lb unintentional weight loss. Subsequent abdominal CT concerning for Ileocecal Crohn's colitis with two small mesenteric abscesses.   ##RLQ Abdominal Pain, suspected Crohn's Colitis: CT abdomen revealed finding consistent w/ extensive Crohn's disease of the distal ileum and cecum with focal mesenteric inflammation. Patient getting colonoscopy today. -Colonoscopy today (10/6) -Pain control w/ IV morphine 2 mg q6h for severe pain -Sulfasalazine 1000 mg TID (total 3 g/daily) for induction of remission, consider d/c as ileal Crohn's better treated with steroids.   -Ondansetron 4 mg Q6 PRN for nausea   -Vitamin D pending  ##Intra-abdominal Abscess: Mesenteric abscesses appreciated on abdominal CT, likely secondary to Crohn's disease. Concern for risk of sepsis given  elevated WBC 12.6. Patient currently afebrile and stable. Medical management of abscesses with antibiotics is indicated at this time,.  -Ciprofloxacin 500 mg po BID and Flagyl 500 mg po q8h for treatment of abscesses.   ##  Leukocytosis: WBC 12.6 with left shift on admission. Downtrended  to 10.5 on 10/6.  Likely 2/2 to active Crohn's. Currently afebrile.  -Ciprofloxacin 500 mg po BID and Flagyl 500 mg po q8h -Blood culture if spike fever >38.5   ##Elevated HbA1C: HbA1C 6.6. Concerning for diabetes vs prediabetes. Start Metformin 500 QD as outpatient and follow-up at [facility] IM clinic in 1-2 weeks.  ##Infectious Disease: LFT normal, no concern for hepatitis. HIV non-reactive  ##DVT PPx: -Lovenox 40 SQ daily   ##Dispostion: Likely discharge today with follow-up in 1-2 weeks at [place] [facility]  This is a Careers information officer Note.  The care of the patient was discussed with Dr. Daryll Drown and the assessment and plan formulated with their assistance.  Please see their attached note for official documentation of the daily encounter.   LOS: 2 days   Florentina Addison, Med Student 11/21/2014, 7:03 AM

## 2014-11-21 NOTE — H&P (View-Only) (Signed)
[facility] CONS[facility] Reason for consult: abnormal CT scan suggesting Crohn's disease Referring Physician:   [name] is an 49 y.o. male.  HPI: he was admitted after several months of abdominal pain.Marland Kitchen He has lost weight. He is not had diarrhea or dysuria. CT scan revealed extensive Crohn's disease of the terminal ileum and cecum with 2 small abscess season the mesentery. Were asked to see him to confirm diagnosis of Crohn's disease. The patient denies any prior knowledge of Crohn's disease. His only home medications have been ibuprofen and Naprosyn which she obtained OTC WBC was slightly elevated at 12.6  Past Medical History  Diagnosis Date  . Hypoglycemia     History reviewed. No pertinent past surgical history.  No family history on file.  Social History:  reports that he has never smoked. He does not have any smokeless tobacco history on file. He reports that he drinks alcohol. He reports that he does not use illicit drugs.  Allergies:  Allergies  Allergen Reactions  . Tylenol [Acetaminophen] Shortness Of Breath    Shaky, heart racing    Medications; Prior to Admission medications   Medication Sig Start Date End Date Taking? Authorizing Provider  ibuprofen (ADVIL,MOTRIN) 200 MG tablet Take 400 mg by mouth every 6 (six) hours as needed for moderate pain.   Yes Historical Provider, MD  naproxen (NAPROSYN) 500 MG tablet Take 1 tablet (500 mg total) by mouth 2 (two) times daily. Patient not taking: Reported on 11/19/2014 07/20/14   Gregor Hams, MD   . ciprofloxacin  500 mg Oral BID  . enoxaparin (LOVENOX) injection  40 mg Subcutaneous Q24H  . metroNIDAZOLE  500 mg Oral 3 times per day  . polyethylene glycol-electrolytes  4,000 mL Oral Once  . sulfaSALAzine  1,000 mg Oral TID   PRN Meds morphine injection, ondansetron **OR** ondansetron (ZOFRAN) IV Results for orders placed or performed during the hospital encounter of 11/19/14 (from the past 48 hour(s))   Lipase, blood     Status: Abnormal   Collection Time: 11/19/14 11:00 AM  Result Value Ref Range   Lipase 19 (L) 22 - 51 U/L  Comprehensive metabolic panel     Status: Abnormal   Collection Time: 11/19/14 11:00 AM  Result Value Ref Range   Sodium 140 135 - 145 mmol/L   Potassium 3.9 3.5 - 5.1 mmol/L   Chloride 104 101 - 111 mmol/L   CO2 28 22 - 32 mmol/L   Glucose, Bld 114 (H) 65 - 99 mg/dL   BUN 7 6 - 20 mg/dL   Creatinine, Ser 0.93 0.61 - 1.24 mg/dL   Calcium 9.3 8.9 - 10.3 mg/dL   Total Protein 7.0 6.5 - 8.1 g/dL   Albumin 3.2 (L) 3.5 - 5.0 g/dL   AST 22 15 - 41 U/L   ALT 25 17 - 63 U/L   Alkaline Phosphatase 71 38 - 126 U/L   Total Bilirubin 0.3 0.3 - 1.2 mg/dL   GFR calc non Af Amer >60 >60 mL/min   GFR calc Af Amer >60 >60 mL/min    Comment: (NOTE) The eGFR has been calculated using the CKD EPI equation. This calculation has not been validated in all clinical situations. eGFR's persistently <60 mL/min signify possible Chronic Kidney Disease.    Anion gap 8 5 - 15  CBC     Status: Abnormal   Collection Time: 11/19/14 11:00 AM  Result Value Ref Range   WBC 12.6 (H) 4.0 - 10.5 K/uL  RBC 5.14 4.22 - 5.81 MIL/uL   Hemoglobin 13.2 13.0 - 17.0 g/dL   HCT 40.5 39.0 - 52.0 %   MCV 78.8 78.0 - 100.0 fL   MCH 25.7 (L) 26.0 - 34.0 pg   MCHC 32.6 30.0 - 36.0 g/dL   RDW 13.9 11.5 - 15.5 %   Platelets 436 (H) 150 - 400 K/uL  Differential     Status: Abnormal   Collection Time: 11/19/14 11:00 AM  Result Value Ref Range   Neutrophils Relative % 80 %   Neutro Abs 10.1 (H) 1.7 - 7.7 K/uL   Lymphocytes Relative 8 %   Lymphs Abs 1.0 0.7 - 4.0 K/uL   Monocytes Relative 8 %   Monocytes Absolute 1.0 0.1 - 1.0 K/uL   Eosinophils Relative 3 %   Eosinophils Absolute 0.4 0.0 - 0.7 K/uL   Basophils Relative 1 %   Basophils Absolute 0.1 0.0 - 0.1 K/uL  Urinalysis, Routine w reflex microscopic (not at [facility])     Status: None   Collection Time: 11/19/14 11:55 AM  Result Value Ref  Range   Color, Urine YELLOW YELLOW   APPearance CLEAR CLEAR   Specific Gravity, Urine 1.020 1.005 - 1.030   pH 7.0 5.0 - 8.0   Glucose, UA NEGATIVE NEGATIVE mg/dL   Hgb urine dipstick NEGATIVE NEGATIVE   Bilirubin Urine NEGATIVE NEGATIVE   Ketones, ur NEGATIVE NEGATIVE mg/dL   Protein, ur NEGATIVE NEGATIVE mg/dL   Urobilinogen, UA 1.0 0.0 - 1.0 mg/dL   Nitrite NEGATIVE NEGATIVE   Leukocytes, UA NEGATIVE NEGATIVE    Comment: MICROSCOPIC NOT DONE ON URINES WITH NEGATIVE PROTEIN, BLOOD, LEUKOCYTES, NITRITE, OR GLUCOSE <1000 mg/dL.  HIV antibody     Status: None   Collection Time: 11/19/14  7:15 PM  Result Value Ref Range   HIV Screen 4th Generation wRfx Non Reactive Non Reactive    Comment: (NOTE) Performed At: [facility] [place], [place] [id number] Lindon Romp MD [id number]   Sedimentation rate     Status: Abnormal   Collection Time: 11/19/14  7:15 PM  Result Value Ref Range   Sed Rate 41 (H) 0 - 16 mm/hr  C-reactive protein     Status: Abnormal   Collection Time: 11/19/14  7:15 PM  Result Value Ref Range   CRP 10.8 (H) <1.0 mg/dL  Vitamin B12     Status: None   Collection Time: 11/19/14  7:15 PM  Result Value Ref Range   Vitamin B-12 307 180 - 914 pg/mL    Comment: (NOTE) This assay is not validated for testing neonatal or myeloproliferative syndrome specimens for Vitamin B12 levels.   Iron and TIBC     Status: Abnormal   Collection Time: 11/19/14  7:15 PM  Result Value Ref Range   Iron 22 (L) 45 - 182 ug/dL   TIBC 293 250 - 450 ug/dL   Saturation Ratios 8 (L) 17.9 - 39.5 %   UIBC 271 ug/dL  Ferritin     Status: None   Collection Time: 11/19/14  7:15 PM  Result Value Ref Range   Ferritin 142 24 - 336 ng/mL  Folate     Status: None   Collection Time: 11/19/14  7:15 PM  Result Value Ref Range   Folate 27.0 >5.9 ng/mL  Hemoglobin A1c     Status: Abnormal   Collection Time: 11/19/14  7:25 PM  Result Value Ref Range   Hgb A1c MFr Bld  6.6 (H)  4.8 - 5.6 %    Comment: (NOTE)         Pre-diabetes: 5.7 - 6.4         Diabetes: >6.4         Glycemic control for adults with diabetes: <7.0    Mean Plasma Glucose 143 mg/dL    Comment: (NOTE) Performed At: [facility] [place], [place] [id number] Lindon Romp MD [id number]   Occult blood card to lab, stool RN will collect     Status: None   Collection Time: 11/19/14 11:05 PM  Result Value Ref Range   Fecal Occult Bld NEGATIVE NEGATIVE  CBC     Status: Abnormal   Collection Time: 11/20/14  5:30 AM  Result Value Ref Range   WBC 11.7 (H) 4.0 - 10.5 K/uL   RBC 5.09 4.22 - 5.81 MIL/uL   Hemoglobin 13.1 13.0 - 17.0 g/dL   HCT 40.3 39.0 - 52.0 %   MCV 79.2 78.0 - 100.0 fL   MCH 25.7 (L) 26.0 - 34.0 pg   MCHC 32.5 30.0 - 36.0 g/dL   RDW 14.2 11.5 - 15.5 %   Platelets 446 (H) 150 - 400 K/uL  Basic metabolic panel     Status: Abnormal   Collection Time: 11/20/14  5:30 AM  Result Value Ref Range   Sodium 135 135 - 145 mmol/L   Potassium 4.1 3.5 - 5.1 mmol/L   Chloride 99 (L) 101 - 111 mmol/L   CO2 27 22 - 32 mmol/L   Glucose, Bld 98 65 - 99 mg/dL   BUN 8 6 - 20 mg/dL   Creatinine, Ser 0.95 0.61 - 1.24 mg/dL   Calcium 8.8 (L) 8.9 - 10.3 mg/dL   GFR calc non Af Amer >60 >60 mL/min   GFR calc Af Amer >60 >60 mL/min    Comment: (NOTE) The eGFR has been calculated using the CKD EPI equation. This calculation has not been validated in all clinical situations. eGFR's persistently <60 mL/min signify possible Chronic Kidney Disease.    Anion gap 9 5 - 15    Ct Abdomen Pelvis W Contrast  11/19/2014   CLINICAL DATA:  Right lower quadrant pain for 3-4 months. Night sweats.  EXAM: CT ABDOMEN AND PELVIS WITH CONTRAST  TECHNIQUE: Multidetector CT imaging of the abdomen and pelvis was performed using the standard protocol following bolus administration of intravenous contrast.  CONTRAST:  181m OMNIPAQUE IOHEXOL 300 MG/ML  SOLN  COMPARISON:  None.   FINDINGS: Lower chest:  Normal.  Hepatobiliary: Normal.  Pancreas: Normal.  Spleen: Normal.  Adrenals/Urinary Tract: Normal adrenal glands. Normal kidneys, ureters, and bladder.  Stomach/Bowel: There is mucosal thickening of multiple loops of the distal ileum including the terminal ileum. There is marked inflammation in the mesenteries adjacent to the ileocecal valve with two tiny areas which may represent abscesses in the mesenteric. No free air or free fluid. The appendix is immediately adjacent to the area of inflammation and there is small area of mucosal thickening in the mid appendix which is felt to be due to the adjacent ileitis and not due to appendicitis. There is slight thickening of the mucosa of the cecum. The remainder of the bowel appears normal.  Vascular/Lymphatic: The vessels are normal. Multiple small reactive nodes in the mesenteries of the bowel.  Reproductive: Small left inguinal hernia containing only fat. Otherwise normal.  Musculoskeletal: No significant abnormality.  IMPRESSION: Findings consistent with extensive Crohn's disease of the distal ileum and  cecum with focal inflammation in the mesentery adjacent at the ileocecal valve with 2 tiny abscesses in the mesentery each measuring less than 1 cm in size.   Electronically Signed   By: Lorriane Shire M.D.   On: 11/19/2014 15:36               Blood pressure 108/80, pulse 89, temperature 97.4 F (36.3 C), temperature source Oral, resp. rate 18, height 5' 11"  (1.803 m), weight 87.998 kg (194 lb), SpO2 97 %.  Physical exam:   General-- Hispanic male in no acute distress, speaks [place] fairly well ENT-- normal Neck-- no lymphadenopathy Heart-- regular rate and rhythm without murmurs or gallops Lungs-- clear Abdomen-- nondistended with good bowel sounds mild tenderness in the right lower quadrant Psych-- alert and oriented, appropriate   Assessment: 1. Abnormal CT scan showing ileitis and changes consistent with  Crohn's disease of the cecum and terminal ileum. He has been a lot of nonsteroidal medications so it is possible this could be related to that. I think a tissue diagnosis is made.  Plan: 1. We will proceed with colonoscopy this admission. Have discussed with patient and family they understand colonoscopy. Pending the results of this he may need to go on a course of steroids.   Barry Carson,Pierrette Scheu L 11/20/2014, 10:46 AM   Pager: [phone] If no answer or after hours call [phone]

## 2014-11-21 NOTE — Discharge Instructions (Signed)
Take prednisone 20 mg once a day with breakfast.  Take Sulfasalazine 1000 mg (2 pills) three times a day.   Take Flagyl 500 mg three times a day for 5 more days.   Take Ciprofloxacin 500 mg twice a day for 5 more days.  Please do not take any Advil (ibuprofen) or Aleve (naproxen) this could worsen your pain. Take Tylenol (Acetaminophen) as needed for pain.  Please follow up with Dr. [name] (Gastroenterologist) on October 13th at 2:45 pm. The charge for that visit will be $85 please bring this with you.   Please follow up in the Internal Medicine Clinic with Dr. Earlene Plater on 11/29/14 as listed in your packet.    Enfermedad de Crohn (Crohn Disease) La enfermedad de Crohn es una enfermedad prolongada (crnica) que afecta el tubo digestivo. Suele causar irritacin e hinchazn (inflamacin) en el intestino delgado y el comienzo del intestino grueso. Sin embargo, puede Technical sales engineer porcin del tubo digestivo. Forma parte de un grupo de enfermedades conocidas como enfermedades intestinales inflamatorias (EII). La enfermedad de Crohn puede empezar lentamente y empeorar con Museum/gallery conservator. Los sntomas pueden aparecer y Geneticist, molecular. Adems, pueden desaparecer durante meses o incluso aos seguidos (remisin). CAUSAS No se conoce la causa exacta de la enfermedad de Crohn. Puede tratarse de una respuesta que hace que el sistema de defensa del organismo (sistema inmunitario) ataque, por error, a las clulas y a los tejidos sanos (respuesta autoinmunitaria). Los genes y el entorno tambin pueden desempear un papel. FACTORES DE RIESGO Puede estar expuesto a un riesgo mayor de tener enfermedad de Crohn si:  Tiene otros familiares con enfermedad de Crohn u otra enfermedad intestinal inflamatoria.  Consume algn producto que contenga tabaco, como cigarrillos, tabaco de Theatre manager o Administrator, Civil Service.  Tiene entre 20 y 55aos.  Es de ascendencia de [place]. [place] SNTOMAS Los principales  signos y sntomas de la enfermedad de Crohn afectan al tubo digestivo. Estos incluyen los siguientes:  Diarrea.  Sangrado rectal.  Necesidad imperiosa de defecar.  La sensacin de que no ha terminado de Advertising copywriter.  Dolor o clicos abdominales.  Estreimiento. Los signos y los sntomas generales de enfermedad de Crohn tambin pueden incluir lo siguiente:  Prdida de peso sin causa aparente.  Fatiga.  Grant Ruts.  Nuseas.  Prdida del apetito.  Dolor en las articulaciones.  Cambios en la visin.  Bultos rojos en la piel. DIAGNSTICO Su mdico puede sospechar la presencia de enfermedad de Crohn en funcin de los sntomas y la historia clnica. El [employer] har un examen fsico. Adems, tal vez deba consultar a un mdico especialista en enfermedades del tubo digestivo Fish farm manager). Pueden realizarle estudios para ayudar a los mdicos a Occupational psychologist. Estos pueden incluir lo siguiente:  Anlisis de sangre.  Anlisis de muestras de materia fecal.  Pruebas de diagnstico por imgenes, como radiografas y tomografas computarizadas (TC).  Estudios para Psychologist, prison and probation services interior de los intestinos con un tubo largo y flexible que tiene una luz y una cmara en el extremo (endoscopia o colonoscopia).  Un procedimiento para tomar muestras de tejido del interior del intestino (biopsia) para que las examinen con un microscopio. TRATAMIENTO  No hay cura para la enfermedad de Crohn. El tratamiento se Tax inspector los sntomas. La enfermedad de Crohn afecta a cada persona de un modo diferente. El tratamiento puede incluir lo siguiente:  Visual merchandiser los intestinos. Beber solo lquidos transparentes o recibir alimentacin por va intravenosa durante un lapso de [employer] a los  intestinos la posibilidad de una curacin, porque no hay defecacin.  Medicamentos. Pueden usarse solos o en combinacin (politerapia) e incluir antibiticos. Pueden indicarle medicamentos para  ayudar a lo siguiente:  Reducir la inflamacin.  Controlar la actividad del sistema inmunitario.  Combatir las infecciones.  Aliviar los clicos y Human resources officer.  Human resources officer.  Ciruga. Tal vez necesite ciruga si:  Los medicamentos y otros tratamientos dejaron de ser eficaces.  Presenta complicaciones debido a la enfermedad de Crohn grave.  Una porcin del intestino est muy daada y debe ser extirpada. INSTRUCCIONES PARA EL CUIDADO EN EL HOGAR  Tome los medicamentos solamente como se lo haya indicado el mdico.  Si le recetaron antibiticos, asegrese de terminarlos, incluso si comienza a sentirse mejor.  Concurra a todas las visitas de control como se lo haya indicado el mdico. Esto es importante.  Converse con mdico acerca de Psychologist, educational dieta. Esto puede ayudar con los sntomas. El mdico podr recomendar cambios, por ejemplo:  Beber ms lquidos.  No consumir [place] y otros alimentos que [place].  Consumir una dieta con bajo contenido de [place].  Evitar los alimentos con alto contenido de [place], como palomitas de maz y frutos secos.  No tomar gaseosas.  Consumir frecuentemente porciones ms pequeas de comida, en lugar de porciones grandes.  Llevar un registro de los alimentos para identificar aquellos que mejoran o intensifican los sntomas.  No consuma ningn producto que contenga tabaco, lo que incluye cigarrillos, tabaco de Theatre manager o Administrator, Civil Service. Si necesita ayuda para dejar de fumar, consulte al mdico.  Limite el consumo de alcohol a no ms de 1 medida por da si es mujer y no est Orthoptist, y 2 medidas si es hombres. Una medida equivale a 12onzas de cerveza, 5onzas de vino o 1onzas de bebidas alcohlicas de alta graduacin.  Haga ejercicio a diario como se lo haya indicado el mdico. SOLICITE ATENCIN MDICA SI:  Tiene diarrea, clicos intestinales y otros problemas gastrointestinales que estn presentes casi todo  el tiempo.  Los sntomas no mejoran con Scientist, research (medical).  Sigue adelgazando.  Le aparece una erupcin cutnea o llagas en la piel.  Tiene problemas en los ojos.  Tiene fiebre.   Los sntomas empeoran.  Presenta nuevos sntomas. SOLICITE ATENCIN MDICA DE INMEDIATO SI:  Tiene diarrea con sangre.  Sufre un dolor abdominal intenso.  No puede defecar.   Esta informacin no tiene Theme park manager el consejo del mdico. Asegrese de hacerle al mdico cualquier pregunta que tenga.   Document Released: 11/11/2004 Document Revised: 02/22/2014 Elsevier Interactive Patient Education [employer]

## 2014-11-21 NOTE — Progress Notes (Signed)
Subjective:  Patient was seen and examined this morning. He continues to have RLQ pain, but denies fever, chills, or diarrhea. He did have one episode of nausea with non-boody vomiting after starting the golytely, but this has resolved.   Objective: Filed Vitals:   11/20/14 0452 11/20/14 1420 11/20/14 2129 11/21/14 0554  BP: 108/80 116/78 120/80 117/80  Pulse: 89 80 85 88  Temp: 97.4 F (36.3 C) 97.8 F (36.6 C) 98.2 F (36.8 C) 97.8 F (36.6 C)  TempSrc: Oral Oral Oral Oral  Resp: 18 16 18 18   Height:      Weight:      SpO2: 97% 99% 98% 99%   General: Vital signs reviewed.  Patient is well-developed and well-nourished, in no acute distress and cooperative with exam.  Cardiovascular: RRR, S1 normal, S2 normal, no murmurs, gallops, or rubs. Pulmonary/Chest: Clear to auscultation bilaterally, no wheezes, rales, or rhonchi. Abdominal: Soft, tender to palpation in RLQ, non-distended, BS +, no masses, organomegaly, or guarding present.  Extremities: No lower extremity edema bilaterally Skin: Warm, dry and intact. No rashes or erythema.  Lab Results: Basic Metabolic Panel:  Recent Labs Lab 11/20/14 0530 11/21/14 0455  NA 135 137  K 4.1 4.2  CL 99* 102  CO2 27 25  GLUCOSE 98 89  BUN 8 8  CREATININE 0.95 0.93  CALCIUM 8.8* 9.0   Liver Function Tests:  Recent Labs Lab 11/19/14 1100  AST 22  ALT 25  ALKPHOS 71  BILITOT 0.3  PROT 7.0  ALBUMIN 3.2*    Recent Labs Lab 11/19/14 1100  LIPASE 19*   CBC:  Recent Labs Lab 11/19/14 1100 11/20/14 0530 11/21/14 0455  WBC 12.6* 11.7* 10.5  NEUTROABS 10.1*  --   --   HGB 13.2 13.1 13.2  HCT 40.5 40.3 40.9  MCV 78.8 79.2 79.3  PLT 436* 446* 420*   Hemoglobin A1C:  Recent Labs Lab 11/19/14 1925  HGBA1C 6.6*   Anemia Panel:  Recent Labs Lab 11/19/14 1915  VITAMINB12 307  FOLATE 27.0  FERRITIN 142  TIBC 293  IRON 22*   Urinalysis:  Recent Labs Lab 11/19/14 1155  COLORURINE YELLOW  LABSPEC  1.020  PHURINE 7.0  GLUCOSEU NEGATIVE  HGBUR NEGATIVE  BILIRUBINUR NEGATIVE  KETONESUR NEGATIVE  PROTEINUR NEGATIVE  UROBILINOGEN 1.0  NITRITE NEGATIVE  LEUKOCYTESUR NEGATIVE   Studies/Results: Ct Abdomen Pelvis W Contrast  11/19/2014   CLINICAL DATA:  Right lower quadrant pain for 3-4 months. Night sweats.  EXAM: CT ABDOMEN AND PELVIS WITH CONTRAST  TECHNIQUE: Multidetector CT imaging of the abdomen and pelvis was performed using the standard protocol following bolus administration of intravenous contrast.  CONTRAST:  OMNIPAQUE IOHEXOL 300 MG/ML  SOLN  COMPARISON:  None.  FINDINGS: Lower chest:  Normal.  Hepatobiliary: Normal.  Pancreas: Normal.  Spleen: Normal.  Adrenals/Urinary Tract: Normal adrenal glands. Normal kidneys, ureters, and bladder.  Stomach/Bowel: There is mucosal thickening of multiple loops of the distal ileum including the terminal ileum. There is marked inflammation in the mesenteries adjacent to the ileocecal valve with two tiny areas which may represent abscesses in the mesenteric. No free air or free fluid. The appendix is immediately adjacent to the area of inflammation and there is small area of mucosal thickening in the mid appendix which is felt to be due to the adjacent ileitis and not due to appendicitis. There is slight thickening of the mucosa of the cecum. The remainder of the bowel appears normal.  Vascular/Lymphatic: The  vessels are normal. Multiple small reactive nodes in the mesenteries of the bowel.  Reproductive: Small left inguinal hernia containing only fat. Otherwise normal.  Musculoskeletal: No significant abnormality.  IMPRESSION: Findings consistent with extensive Crohn's disease of the distal ileum and cecum with focal inflammation in the mesentery adjacent at the ileocecal valve with 2 tiny abscesses in the mesentery each measuring less than 1 cm in size.   Electronically Signed   By: Francene Boyers M.D.   On: 11/19/2014 15:36   Medications:  I have  reviewed the patient's current medications. Prior to Admission:  Prescriptions prior to admission  Medication Sig Dispense Refill Last Dose  . ibuprofen (ADVIL,MOTRIN) 200 MG tablet Take 400 mg by mouth every 6 (six) hours as needed for moderate pain.   11/18/2014 at Unknown time  . naproxen (NAPROSYN) 500 MG tablet Take 1 tablet (500 mg total) by mouth 2 (two) times daily. (Patient not taking: Reported on 11/19/2014) 30 tablet 0    Scheduled: . ciprofloxacin  500 mg Oral BID  . enoxaparin (LOVENOX) injection  40 mg Subcutaneous Q24H  . metroNIDAZOLE  500 mg Oral 3 times per day  . sulfaSALAzine  1,000 mg Oral TID   Scheduled Meds: . ciprofloxacin  500 mg Oral BID  . enoxaparin (LOVENOX) injection  40 mg Subcutaneous Q24H  . metroNIDAZOLE  500 mg Oral 3 times per day  . sulfaSALAzine  1,000 mg Oral TID   Continuous Infusions:  PRN Meds:.morphine injection, ondansetron **OR** ondansetron (ZOFRAN) IV Assessment/Plan: Active Problems:   Intra-abdominal abscess (HCC)   Crohn's colitis (HCC)   Leukocytosis   Crohn disease (HCC)   Crohn's disease of intestine with abscess (HCC)  Ileocolitis with Abscess likely 2/2 Crohn's Disease: Patient continues to have RLQ. Patient is tolerating sulfasalazine 1 gram TID; however, this may not be the best treatment for him given localization to ileum and ileocecal valve. GI has seen the patient and plans for colonoscopy with biopsy today. Patient may need steroids based on results from colonoscopy. Patient was started on abx for abscesses. After discharge, patient will follow up with [facility] clinic.  -GI consult, appreciate recommendations -Sulfasalazine 1000 mg TID, may switch to steroids based on colonoscopy results -Ciprofloxacin 500 mg po BID -Flagyl 500 mg po Q8H -Morphine 2 mg IV Q4H prn pain -Avoid NSAIDs -Vitamin D pending  Elevated HgbA1c: HgbA1c 6.6; however, no history of diabetes and glucose has been well controlled during admission. Patient  will need a repeat HgbA1c to confirm the diagnosis of diabetes at the time of follow up. We will start patient on metformin 500 mg QD WC on discharge for treatment of suspected diabetes, especially if patient is to be started on steroids.  -Repeat HgbA1c at follow up -Initiate Metformin 500 mg QD WC on discharge  DVT/PE ppx: Lovenox SQ QD  Dispo: Disposition is deferred at this time, awaiting improvement of current medical problems.  Anticipated discharge in approximately 1-2 day(s).   The patient does not have a current PCP (No primary care provider on file.) and does need an [facility] hospital follow-up appointment after discharge.  The patient does not have transportation limitations that hinder transportation to clinic appointments.   LOS: 2 days   Jill Alexanders, DO PGY-1 Internal Medicine Resident Pager # [phone] 11/21/2014 10:45 AM

## 2014-11-21 NOTE — Anesthesia Preprocedure Evaluation (Addendum)
Anesthesia Evaluation  Patient identified by MRN, date of birth, ID band Patient awake    Reviewed: Allergy & Precautions, H&P , NPO status , Patient's Chart, lab work & pertinent test results  Airway Mallampati: II  TM Distance: >3 FB     Dental  (+) Teeth Intact, Dental Advidsory Given   Pulmonary neg pulmonary ROS,    Pulmonary exam normal        Cardiovascular negative cardio ROS Normal cardiovascular exam     Neuro/Psych negative neurological ROS  negative psych ROS   GI/Hepatic Neg liver ROS,   Endo/Other  negative endocrine ROS  Renal/GU negative Renal ROS     Musculoskeletal   Abdominal   Peds  Hematology   Anesthesia Other Findings   Reproductive/Obstetrics                            Anesthesia Physical Anesthesia Plan  ASA: II  Anesthesia Plan: MAC   Post-op Pain Management:    Induction: Intravenous  Airway Management Planned: Simple Face Mask  Additional Equipment:   Intra-op Plan:   Post-operative Plan:   Informed Consent: I have reviewed the patients History and Physical, chart, labs and discussed the procedure including the risks, benefits and alternatives for the proposed anesthesia with the patient or authorized representative who has indicated his/her understanding and acceptance.   Dental Advisory Given  Plan Discussed with: Anesthesiologist, CRNA and Surgeon  Anesthesia Plan Comments:        Anesthesia Quick Evaluation

## 2014-11-21 NOTE — Transfer of Care (Signed)
Immediate Anesthesia Transfer of Care Note  Patient: Barry Carson  Procedure(s) Performed: Procedure(s): COLONOSCOPY (N/A)  Patient Location: Endoscopy Unit  Anesthesia Type:MAC  Level of Consciousness: awake, alert  and oriented  Airway & Oxygen Therapy: Patient Spontanous Breathing and Patient connected to face mask oxygen  Post-op Assessment: Report given to RN, Post -op Vital signs reviewed and stable and Patient moving all extremities X 4  Post vital signs: Reviewed and stable  Last Vitals:  Filed Vitals:   11/21/14 1431  BP:   Pulse: 91  Temp:   Resp: 27    Complications: No apparent anesthesia complications

## 2014-11-21 NOTE — Op Note (Signed)
[facility] [address] [place], [address]   COLONOSCOPY PROCEDURE REPORT  PATIENT: Barry Carson, Barry Carson  MR#: [id number] BIRTHDATE: [birth year] , 48  yrs. old GENDER: male ENDOSCOPIST: Carman Ching, MD REFERRED BY:  [facility] hospital[facility] PROCEDURE DATE:  12/19/14 PROCEDURE:   colonoscopy and biopsy ASA CLASS:   class I INDICATIONS:abnormal CT scan suggesting Crohn's disease of the terminal ileum MEDICATIONS: propofol 600 mg IV  DESCRIPTION OF PROCEDURE:   After the risks and benefits and of the procedure were explained, informed consent was obtained.  digital exam was normal         The [employer]ax Adult Colon [id number]  endoscope was introduced through the anus and advanced to the cecum identified appendices Pearson Grippe ileocecal valve      .  The quality of the prep was good      .  The instrument was then slowly withdrawn as the colon was fully examined. Estimated blood loss is zero unless otherwise noted in this procedure report..the ileocecal valve area was markedly inflamed. We attempted to enter the terminal ileum and it was ulcerated and stenotic. Biopsies were taken. The scope was withdrawn in the colon examined. The mucosa of the colon was completely normal with no signs of active IBD. Retro flex fuel was normal.   The scope was then withdrawn from the patient and the procedure completed.  WITHDRAWAL TIME:  COMPLICATIONS: There were no immediate complications. ENDOSCOPIC IMPRESSION: probable Crohn's disease of the ileocecal valve and terminal ileum RECOMMENDATIONS: sulfasalazine and prednisone pending biopsies. Should be able to discharge and follow-up is outpatient.  REPEAT EXAM:  cc:  _______________________________ eSignedCarman Ching, MD Dec 19, 2014 2:32 PM   CPT CODES: ICD CODES:  The ICD and CPT codes recommended by this software are interpretations from the data that the clinical staff has captured with the  software.  The verification of the translation of this report to the ICD and CPT codes and modifiers is the sole responsibility of the health care institution and practicing physician where this report was generated.  [employer] will not be held responsible for the validity of the ICD and CPT codes included on this report.  AMA assumes no liability for data contained or not contained herein. CPT is a Publishing rights manager of the [employer].

## 2014-11-21 NOTE — Discharge Summary (Signed)
Name: Barry Carson MRN: [id number] DOB: [birth year] 49 y.o. PCP: No primary care provider on file.  Date of Admission: 11/19/2014 10:19 AM Date of Discharge: 11/21/2014 Attending Physician: Inez Catalina, MD  Discharge Diagnosis: 1. Crohn's Disease of the cecum and terminal ileum 2. Elevated Hemoglobin A1c  Active Problems:   Intra-abdominal abscess (HCC)   Crohn's colitis (HCC)   Leukocytosis   Crohn disease (HCC)   Crohn's disease of intestine with abscess (HCC)  Discharge Medications:   Medication List    STOP taking these medications        ibuprofen 200 MG tablet  Commonly known as:  ADVIL,MOTRIN     naproxen 500 MG tablet  Commonly known as:  NAPROSYN      TAKE these medications        ciprofloxacin 500 MG tablet  Commonly known as:  CIPRO  Take 1 tablet (500 mg total) by mouth 2 (two) times daily.     metFORMIN 500 MG tablet  Commonly known as:  GLUCOPHAGE  Take 1 tablet (500 mg total) by mouth daily with breakfast.     metroNIDAZOLE 500 MG tablet  Commonly known as:  FLAGYL  Take 1 tablet (500 mg total) by mouth 3 (three) times daily.     predniSONE 20 MG tablet  Commonly known as:  DELTASONE  Take 1 tablet (20 mg total) by mouth daily with breakfast.  Start taking on:  11/22/2014     sulfaSALAzine 500 MG EC tablet  Commonly known as:  AZULFIDINE  Take 2 tablets (1,000 mg total) by mouth 3 (three) times daily.        Disposition and follow-up:   Mr.Barry Carson was discharged from [facility] in Stable condition.  At the hospital follow up visit please address:  1.  Crohn's Colitis: Colonoscopy revealed an inflamed ileocecal valve suspicious for Crohn's. Biopsies were taken and pathology was pending at discharge. Patient was discharge on 20 mg PO Prednisone daily and 1000 g Sulfasalazine TID pending biopsy findings. Patient to follow up with GI on 11/28/14.  2.  Elevated HbA1c: HbA1C was 6.6 on admission.  Glucose was in the normal range. Placed on metformin 500 QD. Please follow-up POCT Glucose as well as HbA1c on follow up.  3.  Labs / imaging needed at time of follow-up: Recheck HbA1C, recheck fasting glucose.   3.  Pending labs/ test needing follow-up: Vit D level, Pathology of intestinal biopsies   Follow-up Appointments: Follow-up Information    Follow up with [name], DO. Go on 11/29/2014.   Specialty:  Internal Medicine   Why:  For follow-up   Contact information:   [address] [place] [address] [phone]       Follow up with [name], MD On 11/28/2014.   Specialty:  Gastroenterology   Why:  2:45 pm. Please bring $85 for this gastroenterology visit.    Contact information:   [address] [place] [address] [phone]       Discharge Instructions: Discharge Instructions    Diet - low sodium heart healthy    Complete by:  As directed      Discharge instructions    Complete by:  As directed   Take prednisone 20 mg once a day with breakfast.  Take Sulfasalazine 1000 mg (2 pills) three times a day.   Take Flagyl 500 mg three times a day for 5 more days.   Take  Ciprofloxacin 500 mg twice a day for 5 more days.  Please do not take any Advil (ibuprofen) or Aleve (naproxen) this could worsen your pain. Take Tylenol (Acetaminophen) as needed for pain.  Please follow up with Dr. [name] (Gastroenterologist) on October 13th at 2:45 pm. The charge for that visit will be $85 please bring this with you.     Increase activity slowly    Complete by:  As directed            Consultations: Treatment Team:  Carman Ching, MD  Procedures Performed:  Ct Abdomen Pelvis W Contrast  11/19/2014   CLINICAL DATA:  Right lower quadrant pain for 3-4 months. Night sweats.  EXAM: CT ABDOMEN AND PELVIS WITH CONTRAST  TECHNIQUE: Multidetector CT imaging of the abdomen and pelvis was performed using the standard protocol following bolus  administration of intravenous contrast.  CONTRAST:  OMNIPAQUE IOHEXOL 300 MG/ML  SOLN  COMPARISON:  None.  FINDINGS: Lower chest:  Normal.  Hepatobiliary: Normal.  Pancreas: Normal.  Spleen: Normal.  Adrenals/Urinary Tract: Normal adrenal glands. Normal kidneys, ureters, and bladder.  Stomach/Bowel: There is mucosal thickening of multiple loops of the distal ileum including the terminal ileum. There is marked inflammation in the mesenteries adjacent to the ileocecal valve with two tiny areas which may represent abscesses in the mesenteric. No free air or free fluid. The appendix is immediately adjacent to the area of inflammation and there is small area of mucosal thickening in the mid appendix which is felt to be due to the adjacent ileitis and not due to appendicitis. There is slight thickening of the mucosa of the cecum. The remainder of the bowel appears normal.  Vascular/Lymphatic: The vessels are normal. Multiple small reactive nodes in the mesenteries of the bowel.  Reproductive: Small left inguinal hernia containing only fat. Otherwise normal.  Musculoskeletal: No significant abnormality.  IMPRESSION: Findings consistent with extensive Crohn's disease of the distal ileum and cecum with focal inflammation in the mesentery adjacent at the ileocecal valve with 2 tiny abscesses in the mesentery each measuring less than 1 cm in size.   Electronically Signed   By: Francene Boyers M.D.   On: 11/19/2014 15:36      Admission HPI:  [name] is a 49 yo male with PMHx of hernia repair presents to the ED with complaint of a 3 months history of dull periumbilical, LLQ and RLQ abdominal pain. Pain is intermittent, worse with bending over and worse with defecation. He is unaware of anything that makes it better. He does admits to occasional subjective fevers. Overall, the pain has been worsening over the last few months. He has never had pain like this before. He has a remote history of umbilical hernia  repair 20 years ago. Pain is severe, 7/10. He also admits to a 15 pound weight loss in the last 3 months, but 6 pounds in the last 2 weeks. This pain is not associated with nausea, vomiting, diarrhea, dysuria, or blood in his stool. He denies any significant family history. He denies alcohol and tobacco use. He denies abusing NSAIDs. He does not take any medications on a daily basis. He does have remote history of kidney stone which passed on its own.   Patient denies any history or current symptoms of arthritis, eye pain, vision changes, skin changes, hx of blood clots.   In the ED, patient was afebrile, normotensive, tachycardic to 102, satting well on room air. CMET was unremarkable including a normal lipase.  CBC revealed a mild leukocytosis of 12.6. UA was normal. CT abdomen/pelvis revealed extensive Crohn's disease of distal ileum and cecucm with focal inflammation in the mesentery adject at the ileocecal valve with 2 tiny abscesses in the mesentery each <1 cm.   Hospital Course by problem list: Active Problems:   Intra-abdominal abscess (HCC)   Crohn's colitis (HCC)   Leukocytosis   Crohn disease (HCC)   Crohn's disease of intestine with abscess (HCC)   1. Crohn's Disease of the cecum and terminal ileum. Patient presented to the ED with worsening RLQ pain in the context of a 3 month history of similar pain and a 15 lb weight loss. Abdominal CT scan was concerning for Crohn's disease of the ileum as well as 2 small mesenteric abscesses. Patient did not require pain medications during his hospital stay, but IV Morphine was available if needed. NSAIDS were avoided as per Crohn's treatment guidelines.  He was placed on sulfasalazine for induction of Crohn's treatent. 20 mg PO Prednisone was later added by the GI consult team.  Patient was placed on a 7 day course of  Flagyl 500 QD and Ciprofloxacin 500 BID for treatment of his small abscesses. FOBT was negative and patient had normal Hemogobin,  folate and B12 and ferritin. Iron was low as well as low TIBC, suggestive of possible early stage anemia of chronic disease. No interventions were necessary as patient was not clinically anemic. Colonoscopy was remarkable for an inflamed ileocecal valve suspicious form Crohn's disease. Biopsies were taken and pending at d/c. Patient was discharged on cipro/flagyl for 5 more days, sulfasalazine 1 gram TID and prednisone 20 mg daily and will follow up with GI.   2. Elevated HbA1c: HbA1c 6.6 during admission. Glucose was within normal range during admission. This is concerning for diabetes however patient needs confirmatory testing. In light of the possiblity of treatment with corticosteroids, metformin 500 mg QD was initiated with instruction to follow-up with [facility] internal medicine clinic. A f/u appointment was scheduled with Dr. Earlene Plater at 3:30 PM on October 14.   Discharge Vitals:   BP 111/73 mmHg  Pulse 74  Temp(Src) 97.4 F (36.3 C) (Axillary)  Resp 20  Ht 5\' 11"  (1.803 m)  Wt 194 lb (87.998 kg)  BMI 27.07 kg/m2  SpO2 100%  Discharge Labs:  Results for orders placed or performed during the hospital encounter of 11/19/14 (from the past 24 hour(s))  CBC     Status: Abnormal   Collection Time: 11/21/14  4:55 AM  Result Value Ref Range   WBC 10.5 4.0 - 10.5 K/uL   RBC 5.16 4.22 - 5.81 MIL/uL   Hemoglobin 13.2 13.0 - 17.0 g/dL   HCT 40.9 81.1 - 91.4 %   MCV 79.3 78.0 - 100.0 fL   MCH 25.6 (L) 26.0 - 34.0 pg   MCHC 32.3 30.0 - 36.0 g/dL   RDW 78.2 95.6 - 21.3 %   Platelets 420 (H) 150 - 400 K/uL  Basic metabolic panel     Status: None   Collection Time: 11/21/14  4:55 AM  Result Value Ref Range   Sodium 137 135 - 145 mmol/L   Potassium 4.2 3.5 - 5.1 mmol/L   Chloride 102 101 - 111 mmol/L   CO2 25 22 - 32 mmol/L   Glucose, Bld 89 65 - 99 mg/dL   BUN 8 6 - 20 mg/dL   Creatinine, Ser 0.86 0.61 - 1.24 mg/dL   Calcium 9.0 8.9 - 57.8 mg/dL  GFR calc non Af Amer >60 >60 mL/min    GFR calc Af Amer >60 >60 mL/min   Anion gap 10 5 - 15    Signed: Jill Alexanders, DO PGY-2 Internal Medicine Resident Pager # [phone] 11/21/2014 6:44 PM   Services Ordered on Discharge: None Equipment Ordered on Discharge: None

## 2014-11-21 NOTE — Interval H&P Note (Signed)
History and Physical Interval Note:  11/21/2014 1:22 PM  Barry Carson  has presented today for surgery, with the diagnosis of crohns  The various methods of treatment have been discussed with the patient and family. After consideration of risks, benefits and other options for treatment, the patient has consented to  Procedure(s): COLONOSCOPY (N/A) as a surgical intervention .  The patient's history has been reviewed, patient examined, no change in status, stable for surgery.  I have reviewed the patient's chart and labs.  Questions were answered to the patient's satisfaction.     Sharece Fleischhacker JR,Raiyan Dalesandro L

## 2014-11-22 ENCOUNTER — Encounter (HOSPITAL_COMMUNITY): Payer: Self-pay | Admitting: Gastroenterology

## 2014-11-23 LAB — VITAMIN D 1,25 DIHYDROXY
VITAMIN D 1, 25 (OH) TOTAL: 89 pg/mL
Vitamin D2 1, 25 (OH)2: <10 pg/mL
Vitamin D3 1, 25 (OH)2: 89 pg/mL

## 2014-11-29 ENCOUNTER — Encounter: Payer: Self-pay | Admitting: Internal Medicine

## 2014-11-29 ENCOUNTER — Ambulatory Visit (INDEPENDENT_AMBULATORY_CARE_PROVIDER_SITE_OTHER): Payer: Self-pay | Admitting: Internal Medicine

## 2014-11-29 VITALS — BP 138/71 | HR 95 | Temp 97.7°F | Wt 192.4 lb

## 2014-11-29 DIAGNOSIS — Z7984 Long term (current) use of oral hypoglycemic drugs: Secondary | ICD-10-CM

## 2014-11-29 DIAGNOSIS — E1110 Type 2 diabetes mellitus with ketoacidosis without coma: Secondary | ICD-10-CM

## 2014-11-29 DIAGNOSIS — E1310 Other specified diabetes mellitus with ketoacidosis without coma: Secondary | ICD-10-CM

## 2014-11-29 DIAGNOSIS — E119 Type 2 diabetes mellitus without complications: Secondary | ICD-10-CM

## 2014-11-29 DIAGNOSIS — K50914 Crohn's disease, unspecified, with abscess: Secondary | ICD-10-CM

## 2014-11-29 LAB — GLUCOSE, CAPILLARY: GLUCOSE-CAPILLARY: 149 mg/dL — AB (ref 65–99)

## 2014-11-29 LAB — POCT GLYCOSYLATED HEMOGLOBIN (HGB A1C): Hemoglobin A1C: 5.9

## 2014-11-29 NOTE — Patient Instructions (Addendum)
Please try to bring all your medicines next time. This helps [place] take good care of you and stops mistakes from medicines that could hurt you.  Please come back to see me in 3 months for routine follow up.  Thanks!   La diabetes mellitus y los alimentos (Diabetes Mellitus and Food) Es importante que controle su nivel de azcar en la sangre (glucosa). El nivel de glucosa en sangre depende en gran medida de lo que usted come. Comer alimentos saludables en las cantidades [place] a lo largo del Futures trader, aproximadamente a la misma hora [employer], lo ayudar a Chief Operating Officer su nivel de Event organiser. Tambin puede ayudarlo a retrasar o Fish farm manager de la diabetes mellitus. Comer de [employer] saludable incluso puede ayudarlo a Event organiser de presin arterial y a Barista o Pharmacologist un peso saludable.  Entre las recomendaciones generales para alimentarse y Water quality scientist los alimentos de forma saludable, se incluyen las siguientes:  Respetar las comidas principales y comer colaciones con regularidad. Evitar pasar largos perodos sin comer con el fin de perder peso.  Seguir una dieta que consista principalmente en alimentos de origen vegetal, como frutas, vegetales, frutos secos, legumbres y cereales integrales.  Utilizar mtodos de coccin a baja temperatura, como hornear, en lugar de mtodos de coccin a alta temperatura, como frer en abundante aceite. Trabaje con el nutricionista para aprender a Acupuncturist nutricional de las etiquetas de los alimentos. CMO PUEDEN AFECTARME LOS ALIMENTOS? Carbohidratos Los carbohidratos afectan el nivel de glucosa en sangre ms que cualquier otro tipo de alimento. El nutricionista lo ayudar a Chief Strategy Officer cuntos carbohidratos puede consumir en cada comida y ensearle a contarlos. El recuento de carbohidratos es importante para mantener la glucosa en sangre en un nivel saludable, en especial si utiliza insulina o toma determinados medicamentos para la  diabetes mellitus. Alcohol El alcohol puede provocar disminuciones sbitas de la glucosa en sangre (hipoglucemia), en especial si utiliza insulina o toma determinados medicamentos para la diabetes mellitus. La hipoglucemia es una afeccin que puede poner en peligro la vida. Los sntomas de la hipoglucemia (somnolencia, mareos y Administrator) son similares a los sntomas de haber consumido mucho alcohol.  Si el mdico lo autoriza a beber alcohol, hgalo con moderacin y siga estas pautas:  Las mujeres no deben beber ms de un trago por da, y los hombres no deben beber ms de dos tragos por Futures trader. Un trago es igual a:  12 onzas (355 ml) de cerveza  5 onzas de vino (150 ml) de vino  1,5onzas (45ml) de bebidas espirituosas  No beba con el estmago vaco.  Mantngase hidratado. Beba agua, gaseosas dietticas o t helado sin azcar.  Las gaseosas comunes, los jugos y otros refrescos podran contener muchos carbohidratos y se Heritage manager. QU ALIMENTOS NO SE RECOMIENDAN? Cuando haga las elecciones de alimentos, es importante que recuerde que todos los alimentos son distintos. Algunos tienen menos nutrientes que otros por porcin, aunque podran tener la misma cantidad de caloras o carbohidratos. Es difcil darle al cuerpo lo que necesita cuando consume alimentos con menos nutrientes. Estos son algunos ejemplos de alimentos que debera evitar ya que contienen muchas caloras y carbohidratos, pero pocos nutrientes:  Neurosurgeon trans (la mayora de los alimentos procesados incluyen grasas trans en la etiqueta de Informacin nutricional).  Gaseosas comunes.  Jugos.  Caramelos.  Dulces, como tortas, pasteles, rosquillas y [place].  Comidas fritas. QU ALIMENTOS PUEDO COMER? Consuma alimentos ricos en nutrientes, que nutrirn el cuerpo y lo  mantendrn saludable. Los alimentos que debe comer tambin dependern de varios factores, como:  Las caloras que necesita.  Los medicamentos que  toma.  Su peso.  El nivel de glucosa en [place].  El [place] de presin arterial.  El nivel de colesterol. Debe consumir una amplia variedad de alimentos, por ejemplo:  Protenas.  Cortes de [employer].  Protenas con bajo contenido de grasas saturadas, como pescado, clara de huevo y frijoles. Evite las carnes procesadas.  Frutas y vegetales.  Frutas y Sports administrator que pueden ayudar a Chief Operating Officer los niveles sanguneos de [place], como [place], mangos y batatas.  Productos lcteos.  Elija productos lcteos sin grasa o con bajo contenido de [place], como [place], yogur y [place].  Cereales, panes, pastas y arroz.  Elija cereales integrales, como panes multicereales, avena en grano y arroz integral. Estos alimentos pueden ayudar a controlar la presin arterial.  Rosalin Hawking.  Alimentos que contengan grasas saludables, como frutos secos, Chartered certified accountant, aceite de [place], aceite de canola y pescado. TODOS LOS QUE PADECEN DIABETES MELLITUS TIENEN EL MISMO PLAN DE COMIDAS? Dado que todas las personas que padecen diabetes mellitus son distintas, no hay un solo plan de comidas que funcione para todos. Es muy importante que se rena con un nutricionista que lo ayudar a crear un plan de comidas adecuado para usted.   Esta informacin no tiene Theme park manager el consejo del mdico. Asegrese de hacerle al mdico cualquier pregunta que tenga.   Document Released: 05/11/2007 Document Revised: 02/22/2014 Elsevier Interactive Patient Education [employer]

## 2014-11-29 NOTE — Assessment & Plan Note (Addendum)
Assessment: Patient with hemoglobin A1c during admission of 6.6.  Glucose was within normal limits during admission.  Due to concern for DM as well as addition of corticosteroids, metformin 500mg  daily was added.  Today in the clinic, patients POCT Hgb A1c was 5.9 and fingerstick CBG was 149.  Patient has tolerated metformin with no side effects. BP today 138/71  Plan: -continue Metformin 500mg  daily while on corticosteroids.  If patient comes off daily prednisone and/or A1c continues to be at or below goal, we can likely discontinue metformin in the future -recheck Hgb A1c at follow up -check lipids today with DM and age >15 -encouraged diet and lifestyle modification and provided handout on nutrition in Spanish -continue to monitor BP at future visits.  With diagnosis of DM, goal is <130/90  ADDENDUM 12/01/14: Lipid panel shows TC 115, HDL 35, LDL 68.  ASCVD 10 year risk of 4.8%.  Recommendation is moderate intensity statin therapy should be initiated or maintained for adults 51 to 48 years of age with diabetes mellitus.  However, patients diagnosis of DM is unclear at this point with repeat A1c at office visit being Pre-diabetes at 5.9.  Will discuss risk vs benefit of statin therapy with patient at follow up.  Without diagnosis of DM, ASCVD 10 year risk is 2.5% and statin therapy is not recommended due to risk being <5%.

## 2014-11-29 NOTE — Progress Notes (Signed)
Patient ID: Jamiel Goncalves, male   DOB: Nov 07, 1965, 49 y.o.   MRN: [id number]   Subjective:   Patient ID: Akim Watkinson male   DOB: [birth year] 49 y.o.   MRN: [id number]  HPI: Mr.Ilay Lenix Kidd is a 49 y.o. male with past medical history of DM type 2 and Crohn's disease who presents to [facility] today for hospital follow up.  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  Patient was following up for his new diagnoses of Crohn's and DM.  Crohn's disease: Patient presented to the ED on 11/19/2014 with worsening RLQ pain in the context of a 3 month history of similar pain and a 15 lb weight loss. Abdominal CT scan was concerning for Crohn's disease of the ileum as well as 2 small mesenteric abscesses. He was placed on sulfasalazine for induction of Crohn's treatent. 20 mg PO Prednisone was later added by the GI consult team. Patient was placed on a 7 day course of Flagyl 500 QD and Ciprofloxacin 500 BID for treatment of his small abscesses. FOBT was negative and patient had normal Hemogobin, folate and B12 and ferritin. Iron was low as well as low TIBC, suggestive of possible early stage anemia of chronic disease. No interventions were necessary as patient was not clinically anemic. Colonoscopy was remarkable for an inflamed ileocecal valve suspicious form Crohn's disease. Patient was discharged on cipro/flagyl for 5 more days, sulfasalazine 1 gram TID and prednisone 20 mg daily and will follow up with GI.  Patient completed course of Cipro and Flagyl.  Follow up with GI was yesterday, 11/28/14, and sulfasalazine and prednisone were continued.  Patient is to follow up with Dr. [name] [facility]) on 01/15/2015.  Surgical pathology obtained from colonoscopy revealed minimally active chronic colitis with histologic features consistent Crohn's more so than ulcerative colitis.  There was no evidence of dysplasia or malignancy.  Since discharge, patient reports  improvement in symptoms and today denies any abdominal pain.  DM type 2: hemoglobin A1c during admission was 6.6.  Glucose was within normal limits during admission.  Due to concern for DM as well as addition of corticosteroids, metformin  daily was added.  Today in the clinic, patients POCT Hgb A1c was 5.9 and fingerstick CBG was 149.  Patient has tolerated metformin with no side effects.     Past Medical History  Diagnosis Date  . Diabetes mellitus without complication (HCC)   . Crohn's disease (HCC) 11/2014   Current Outpatient Prescriptions  Medication Sig Dispense Refill  . metFORMIN (GLUCOPHAGE) 500 MG tablet Take 1 tablet (500 mg total) by mouth daily with breakfast. 30 tablet 1  . predniSONE (DELTASONE) 20 MG tablet Take 1 tablet (20 mg total) by mouth daily with breakfast. 30 tablet 0  . sulfaSALAzine (AZULFIDINE) 500 MG EC tablet Take 2 tablets (1,000 mg total) by mouth 3 (three) times daily. 180 tablet 0   No current facility-administered medications for this visit.   History reviewed. No pertinent family history. Social History   Social History  . Marital Status: Single    Spouse Name: N/A  . Number of Children: N/A  . Years of Education: N/A   Social History Main Topics  . Smoking status: Never Smoker   . Smokeless tobacco: None  . Alcohol Use: Yes     Comment: socially  . Drug Use: No  . Sexual Activity: Not Asked   Other Topics Concern  . None   Social History  Narrative   Review of Systems: Review of Systems  Constitutional: Negative for fever and chills.  HENT: Negative for congestion and sore throat.   Eyes: Negative for pain.  Respiratory: Negative for cough and shortness of breath.   Cardiovascular: Negative for chest pain.  Gastrointestinal: Negative for abdominal pain.  Genitourinary: Negative for difficulty urinating.  Musculoskeletal: Negative for back pain.  Skin: Negative for color change.  Neurological: Negative for dizziness.    Psychiatric/Behavioral: Negative for agitation.     Objective:  Physical Exam: Filed Vitals:   11/29/14 1556  BP: 138/71  Pulse: 95  Temp: 97.7 F (36.5 C)  TempSrc: Oral  Weight: 192 lb 6.4 oz (87.272 kg)  SpO2: 99%   Physical Exam  Constitutional: He is oriented to person, place, and time. He appears well-developed and well-nourished.  HENT:  Head: Normocephalic and atraumatic.  Eyes: EOM are normal.  Neck: Normal range of motion.  Cardiovascular: Normal rate and regular rhythm.   Respiratory: Effort normal and breath sounds normal.  GI: Soft. Bowel sounds are normal. There is no tenderness. There is no rebound.  Musculoskeletal: Normal range of motion.  Neurological: He is alert and oriented to person, place, and time.  Skin: Skin is warm and dry.  Psychiatric: He has a normal mood and affect.     Assessment & Plan:  Please see Problem List for Assessment and Plan

## 2014-11-29 NOTE — Assessment & Plan Note (Signed)
Assessment: Patient presented to the ED on 11/19/2014 with worsening RLQ pain in the context of a 3 month history of similar pain and a 15 lb weight loss. Abdominal CT scan was concerning for Crohn's disease of the ileum as well as 2 small mesenteric abscesses. He was placed on sulfasalazine for induction of Crohn's treatent. 20 mg PO Prednisone was later added by the GI consult team. Patient was placed on a 7 day course of Flagyl 500 QD and Ciprofloxacin 500 BID for treatment of his small abscesses. FOBT was negative and patient had normal Hemogobin, folate and B12 and ferritin. Iron was low as well as low TIBC, suggestive of possible early stage anemia of chronic disease. No interventions were necessary as patient was not clinically anemic. Colonoscopy was remarkable for an inflamed ileocecal valve suspicious form Crohn's disease. Patient was discharged on cipro/flagyl for 5 more days, sulfasalazine 1 gram TID and prednisone 20 mg daily and will follow up with GI.  Patient completed course of Cipro and Flagyl.  Follow up with GI was yesterday, 11/28/14, and sulfasalazine and prednisone were continued.  Patient is to follow up with Dr. [name] [facility]) on 01/15/2015.  Surgical pathology obtained from colonoscopy revealed minimally active chronic colitis with histologic features consistent Crohn's more so than ulcerative colitis.  There was no evidence of dysplasia or malignancy.  Since discharge, patient reports improvement in symptoms and today denies any abdominal pain.  Plan: -continue close follow up with GI for management of Crohn's colitis -continue sulfasalazine 1gm TID and prednisone 20mg  daily per GI

## 2014-11-30 LAB — LIPID PANEL
CHOL/HDL RATIO: 3.3 ratio (ref 0.0–5.0)
CHOLESTEROL TOTAL: 115 mg/dL (ref 100–199)
HDL: 35 mg/dL — ABNORMAL LOW (ref >39)
LDL CALC: 68 mg/dL (ref 0–99)
TRIGLYCERIDES: 58 mg/dL (ref 0–149)
VLDL CHOLESTEROL CAL: 12 mg/dL (ref 5–40)

## 2014-12-02 NOTE — Progress Notes (Signed)
Internal Medicine Clinic Attending  I saw and evaluated the patient.  I personally confirmed the key portions of the history and exam documented by Dr. Wallace and I reviewed pertinent patient test results.  The assessment, diagnosis, and plan were formulated together and I agree with the documentation in the resident's note. 

## 2014-12-02 NOTE — Addendum Note (Signed)
Addended by: Erlinda Hong T on: 12/02/2014 10:56 AM   Modules accepted: Level of Service

## 2015-01-15 ENCOUNTER — Emergency Department (HOSPITAL_COMMUNITY): Payer: Self-pay

## 2015-01-15 ENCOUNTER — Encounter (HOSPITAL_COMMUNITY): Payer: Self-pay | Admitting: Neurology

## 2015-01-15 ENCOUNTER — Inpatient Hospital Stay (HOSPITAL_COMMUNITY)
Admission: EM | Admit: 2015-01-15 | Discharge: 2015-01-17 | DRG: 386 | Disposition: A | Discharge status: Home / Self Care | Payer: Self-pay | Attending: Internal Medicine | Admitting: Internal Medicine

## 2015-01-15 DIAGNOSIS — R1031 Right lower quadrant pain: Secondary | ICD-10-CM

## 2015-01-15 DIAGNOSIS — Z7984 Long term (current) use of oral hypoglycemic drugs: Secondary | ICD-10-CM

## 2015-01-15 DIAGNOSIS — K50114 Crohn's disease of large intestine with abscess: Principal | ICD-10-CM | POA: Diagnosis present

## 2015-01-15 DIAGNOSIS — R112 Nausea with vomiting, unspecified: Secondary | ICD-10-CM | POA: Diagnosis present

## 2015-01-15 DIAGNOSIS — R109 Unspecified abdominal pain: Secondary | ICD-10-CM | POA: Diagnosis present

## 2015-01-15 DIAGNOSIS — K50914 Crohn's disease, unspecified, with abscess: Secondary | ICD-10-CM | POA: Diagnosis present

## 2015-01-15 DIAGNOSIS — Z79899 Other long term (current) drug therapy: Secondary | ICD-10-CM

## 2015-01-15 DIAGNOSIS — E119 Type 2 diabetes mellitus without complications: Secondary | ICD-10-CM

## 2015-01-15 LAB — COMPREHENSIVE METABOLIC PANEL
ALT: 32 U/L (ref 17–63)
AST: 21 U/L (ref 15–41)
Albumin: 2.7 g/dL — ABNORMAL LOW (ref 3.5–5.0)
Alkaline Phosphatase: 192 U/L — ABNORMAL HIGH (ref 38–126)
Anion gap: 10 (ref 5–15)
BILIRUBIN TOTAL: 0.6 mg/dL (ref 0.3–1.2)
BUN: 8 mg/dL (ref 6–20)
CO2: 28 mmol/L (ref 22–32)
Calcium: 9.3 mg/dL (ref 8.9–10.3)
Chloride: 98 mmol/L — ABNORMAL LOW (ref 101–111)
Creatinine, Ser: 0.84 mg/dL (ref 0.61–1.24)
Glucose, Bld: 108 mg/dL — ABNORMAL HIGH (ref 65–99)
POTASSIUM: 4 mmol/L (ref 3.5–5.1)
Sodium: 136 mmol/L (ref 135–145)
TOTAL PROTEIN: 7.9 g/dL (ref 6.5–8.1)

## 2015-01-15 LAB — URINALYSIS, ROUTINE W REFLEX MICROSCOPIC
Bilirubin Urine: NEGATIVE
Glucose, UA: NEGATIVE mg/dL
Hgb urine dipstick: NEGATIVE
KETONES UR: NEGATIVE mg/dL
LEUKOCYTES UA: NEGATIVE
NITRITE: NEGATIVE
PROTEIN: NEGATIVE mg/dL
Specific Gravity, Urine: 1.014 (ref 1.005–1.030)
pH: 6.5 (ref 5.0–8.0)

## 2015-01-15 LAB — I-STAT CG4 LACTIC ACID, ED
LACTIC ACID, VENOUS: 0.63 mmol/L (ref 0.5–2.0)
Lactic Acid, Venous: 0.77 mmol/L (ref 0.5–2.0)

## 2015-01-15 LAB — C-REACTIVE PROTEIN: CRP: 19.6 mg/dL — AB (ref <1.0)

## 2015-01-15 LAB — CBC
HEMATOCRIT: 36.3 % — AB (ref 39.0–52.0)
Hemoglobin: 11.7 g/dL — ABNORMAL LOW (ref 13.0–17.0)
MCH: 25.3 pg — ABNORMAL LOW (ref 26.0–34.0)
MCHC: 32.2 g/dL (ref 30.0–36.0)
MCV: 78.4 fL (ref 78.0–100.0)
PLATELETS: 680 10*3/uL — AB (ref 150–400)
RBC: 4.63 MIL/uL (ref 4.22–5.81)
RDW: 14.9 % (ref 11.5–15.5)
WBC: 17.4 10*3/uL — AB (ref 4.0–10.5)

## 2015-01-15 LAB — LIPASE, BLOOD: Lipase: 21 U/L (ref 11–51)

## 2015-01-15 LAB — SEDIMENTATION RATE: SED RATE: 100 mm/h — AB (ref 0–16)

## 2015-01-15 MED ORDER — IOHEXOL 300 MG/ML  SOLN
80.0000 mL | Freq: Once | INTRAMUSCULAR | Status: AC | PRN
  Administered 2015-01-15: 80 mL via INTRAVENOUS

## 2015-01-15 MED ORDER — BARIUM SULFATE 2.1 % PO SUSP: 450.0000 mL | Freq: Once | ORAL | Status: DC

## 2015-01-15 MED ORDER — MORPHINE SULFATE (PF) 4 MG/ML IV SOLN
4.0000 mg | Freq: Once | INTRAVENOUS | Status: AC
  Administered 2015-01-15: 4 mg via INTRAVENOUS
  Filled 2015-01-15: qty 1

## 2015-01-15 MED ORDER — SODIUM CHLORIDE 0.9 % IV BOLUS (SEPSIS)
1000.0000 mL | Freq: Once | INTRAVENOUS | Status: AC
  Administered 2015-01-15: 1000 mL via INTRAVENOUS

## 2015-01-15 MED ORDER — INSULIN ASPART 100 UNIT/ML ~~LOC~~ SOLN
0.0000 [IU] | SUBCUTANEOUS | Status: DC
  Administered 2015-01-16: 2 [IU] via SUBCUTANEOUS
  Administered 2015-01-16: 3 [IU] via SUBCUTANEOUS
  Administered 2015-01-16 (×2): 1 [IU] via SUBCUTANEOUS
  Administered 2015-01-16: 2 [IU] via SUBCUTANEOUS
  Administered 2015-01-17 (×3): 1 [IU] via SUBCUTANEOUS

## 2015-01-15 MED ORDER — ONDANSETRON HCL 4 MG/2ML IJ SOLN
4.0000 mg | Freq: Once | INTRAMUSCULAR | Status: AC
  Administered 2015-01-15: 4 mg via INTRAVENOUS
  Filled 2015-01-15: qty 2

## 2015-01-15 MED ORDER — FENTANYL CITRATE (PF) 100 MCG/2ML IJ SOLN
100.0000 ug | Freq: Once | INTRAMUSCULAR | Status: AC
  Administered 2015-01-15: 100 ug via INTRAVENOUS
  Filled 2015-01-15: qty 2

## 2015-01-15 MED ORDER — BARIUM SULFATE 2.1 % PO SUSP
ORAL | Status: AC
  Filled 2015-01-15: qty 1

## 2015-01-15 MED ORDER — SODIUM CHLORIDE 0.9 % IV SOLN
INTRAVENOUS | Status: DC
  Administered 2015-01-15: 23:00:00 via INTRAVENOUS

## 2015-01-15 MED ORDER — METHYLPREDNISOLONE SODIUM SUCC 125 MG IJ SOLR
125.0000 mg | Freq: Once | INTRAMUSCULAR | Status: AC
  Administered 2015-01-15: 125 mg via INTRAVENOUS
  Filled 2015-01-15: qty 2

## 2015-01-15 MED ORDER — PIPERACILLIN-TAZOBACTAM 3.375 G IVPB 30 MIN
3.3750 g | Freq: Once | INTRAVENOUS | Status: AC
  Administered 2015-01-15: 3.375 g via INTRAVENOUS
  Filled 2015-01-15: qty 50

## 2015-01-15 MED ORDER — METHYLPREDNISOLONE SODIUM SUCC 125 MG IJ SOLR
80.0000 mg | Freq: Two times a day (BID) | INTRAMUSCULAR | Status: DC
  Administered 2015-01-15 – 2015-01-16 (×3): 80 mg via INTRAVENOUS
  Filled 2015-01-15 (×3): qty 2

## 2015-01-15 MED ORDER — SULFASALAZINE 500 MG PO TBEC
1000.0000 mg | DELAYED_RELEASE_TABLET | Freq: Three times a day (TID) | ORAL | Status: DC
  Administered 2015-01-15 – 2015-01-17 (×5): 1000 mg via ORAL
  Filled 2015-01-15 (×8): qty 2

## 2015-01-15 MED ORDER — MORPHINE SULFATE (PF) 2 MG/ML IV SOLN: 2.0000 mg | INTRAVENOUS | Status: DC | PRN

## 2015-01-15 MED ORDER — IBUPROFEN 400 MG PO TABS
600.0000 mg | ORAL_TABLET | Freq: Once | ORAL | Status: AC
  Administered 2015-01-15: 600 mg via ORAL
  Filled 2015-01-15: qty 1

## 2015-01-15 MED ORDER — ONDANSETRON HCL 4 MG/2ML IJ SOLN: 4.0000 mg | Freq: Four times a day (QID) | INTRAMUSCULAR | Status: DC | PRN

## 2015-01-15 MED ORDER — ONDANSETRON HCL 4 MG PO TABS
4.0000 mg | ORAL_TABLET | Freq: Four times a day (QID) | ORAL | Status: DC | PRN
Start: 2015-01-15 — End: 2015-01-17

## 2015-01-15 MED ORDER — PIPERACILLIN-TAZOBACTAM 3.375 G IVPB
3.3750 g | Freq: Three times a day (TID) | INTRAVENOUS | Status: DC
  Administered 2015-01-16 – 2015-01-17 (×4): 3.375 g via INTRAVENOUS
  Filled 2015-01-15 (×6): qty 50

## 2015-01-15 NOTE — ED Notes (Addendum)
Pt here with crohn's, has had pain for 8 days, pain to right middle quad. Vomiting x 2. Last BM was today. His GI MD sent here for CT scan from appointment. Concern for abscess. Pt febrile, tachycardic in triage.

## 2015-01-15 NOTE — ED Notes (Signed)
Patient brought back to room with family in tow; patient is getting undressed and into a gown at this time; visitors at bedside; Thayer Ohm, RN aware of patient

## 2015-01-15 NOTE — ED Notes (Signed)
Patient transported to CT 

## 2015-01-15 NOTE — Progress Notes (Signed)
ANTIBIOTIC CONSULT NOTE - INITIAL  Pharmacy Consult for zosyn Indication: intra-abdominal infection  Allergies  Allergen Reactions  . Tylenol [Acetaminophen] Shortness Of Breath    Shaky, heart racing    Patient Measurements: Weight: 192 lb 7.4 oz (87.3 kg)  Vital Signs: Temp: 100.6 F (38.1 C) (11/30 1633) Temp Source: Oral (11/30 1633) BP: 104/60 mmHg (11/30 2230) Pulse Rate: 114 (11/30 2200) Intake/Output from previous day:   Intake/Output from this shift: Total I/O In: 50 [I.V.:50] Out: -   Labs:  Recent Labs  01/15/15 1651  WBC 17.4*  HGB 11.7*  PLT 680*  CREATININE 0.84   Estimated Creatinine Clearance: 114.5 mL/min (by C-G formula based on Cr of 0.84). No results for input(s): VANCOTROUGH, VANCOPEAK, VANCORANDOM, GENTTROUGH, GENTPEAK, GENTRANDOM, TOBRATROUGH, TOBRAPEAK, TOBRARND, AMIKACINPEAK, AMIKACINTROU, AMIKACIN in the last 72 hours.   Microbiology: No results found for this or any previous visit (from the past 720 hour(s)).  Medical History: Past Medical History  Diagnosis Date  . Diabetes mellitus without complication (HCC)   . Crohn's disease (HCC) 11/2014    Assessment: 49 yo m presenting to the ED on 11/30 with abdominal pain.  CT of abdomen showed worsening Crohn's disease with suspicion for early abscesses. Pharmacy is consulted to dose Zosyn for an intra-abdominal infection.  Wbc 17.4, tmax 100.6, SCr 0.84, CrCl > 100 ml/min.  Zosyn 11/30 >>  Goal of Therapy:  Correct dosing of antibiotics  Plan:  Zosyn 3.375 gm IV q8h (4-hr infusion) F/u LOT Monitor cx, cbc, plans for surgery, clinical course  Danielle Mink L. Roseanne Reno, PharmD PGY2 Infectious Diseases Pharmacy Resident Pager: [phone] 01/15/2015 10:42 PM

## 2015-01-15 NOTE — ED Provider Notes (Signed)
CSN: [id number]     Arrival date & time 01/15/15  1544 History   First MD Initiated Contact with Patient 01/15/15 1815     Chief Complaint  Patient presents with  . Abdominal Pain     (Consider location/radiation/quality/duration/timing/severity/associated sxs/prior Treatment) HPI Patient is a 49 year old male with past medical history significant for diabetes and Crohn's disease (on prednisone), who presents to the emergency department for right-sided abdominal pain. Reports an 8 day history of right-sided abdominal pain, intermittent, dull, nonradiating. Has not noted anything to worsen the pain. Has taken naproxen with some improvement of the pain. Pain is associated with nausea, vomiting, bloody diarrhea, fevers, anorexia. Denies dysuria, hematuria. Denies prior history of kidney stones. Last bowel movement this morning, nonbloody. Patient was evaluated at his gastroenterologist as an outpatient appointment for the abdominal pain, recommended going to the emergency department for CT abdomen and pelvis to evaluate for sequela of Crohn's disease.   Past Medical History  Diagnosis Date  . Diabetes mellitus without complication ([place])   . Crohn's disease ([place]) 11/2014   Past Surgical History  Procedure Laterality Date  . Colonoscopy N/A 11/21/2014    Procedure: COLONOSCOPY;  Surgeon: Laurence Spates, MD;  Location: [facility] [facility];  Service: Endoscopy;  Laterality: N/A;   No family history on file. Social History  Substance Use Topics  . Smoking status: Never Smoker   . Smokeless tobacco: None  . Alcohol Use: Yes     Comment: socially    Review of Systems  Constitutional: Positive for fever, chills and appetite change (decreased).  HENT: Negative for congestion.   Eyes: Negative for visual disturbance.  Respiratory: Negative for chest tightness and shortness of breath.   Cardiovascular: Negative for chest pain and palpitations.  Gastrointestinal: Positive for nausea, vomiting,  abdominal pain, diarrhea and blood in stool.  Genitourinary: Negative for dysuria, hematuria, flank pain, decreased urine volume, penile pain and testicular pain.  Musculoskeletal: Negative for back pain.  Skin: Negative for rash.  Neurological: Negative for dizziness, seizures, weakness, light-headedness and headaches.  Psychiatric/Behavioral: Negative for behavioral problems.      Allergies  Tylenol  Home Medications   Prior to Admission medications   Medication Sig Start Date End Date Taking? Authorizing Provider  metFORMIN (GLUCOPHAGE) 500 MG tablet Take 1 tablet (500 mg total) by mouth daily with breakfast. 11/21/14  Yes Alexa Sherral Hammers, MD  sulfaSALAzine (AZULFIDINE) 500 MG EC tablet Take 2 tablets (1,000 mg total) by mouth 3 (three) times daily. 11/21/14  Yes Alexa Sherral Hammers, MD   BP 133/87 mmHg  Pulse 121  Temp(Src) 98.7 F (37.1 C) (Oral)  Resp 19  Wt 86 kg  SpO2 98% Physical Exam  Constitutional: He is oriented to person, place, and time. He appears well-developed and well-nourished. No distress.  HENT:  Head: Normocephalic and atraumatic.  Mouth/Throat: Oropharynx is clear and moist.  Eyes: Conjunctivae and EOM are normal. Pupils are equal, round, and reactive to light.  Neck: Normal range of motion. Neck supple. No JVD present.  Cardiovascular: Normal rate, regular rhythm, normal heart sounds and intact distal pulses.   Pulmonary/Chest: Effort normal and breath sounds normal. No respiratory distress. He has no wheezes.  Abdominal: Soft. He exhibits no distension and no mass. There is tenderness. There is no rigidity, no rebound, no guarding, no CVA tenderness, no tenderness at McBurney's point and negative Murphy's sign. No hernia.    Periumbilical scar  Musculoskeletal: Normal range of motion.  Neurological: He is alert and oriented  to person, place, and time.  Skin: Skin is warm.  Psychiatric: He has a normal mood and affect.  Nursing note  reviewed.   ED Course  Procedures (including critical care time) Labs Review Labs Reviewed  COMPREHENSIVE METABOLIC PANEL - Abnormal; Notable for the following:    Chloride 98 (*)    Glucose, Bld 108 (*)    Albumin 2.7 (*)    Alkaline Phosphatase 192 (*)    All other components within normal limits  CBC - Abnormal; Notable for the following:    WBC 17.4 (*)    Hemoglobin 11.7 (*)    HCT 36.3 (*)    MCH 25.3 (*)    Platelets 680 (*)    All other components within normal limits  URINALYSIS, ROUTINE W REFLEX MICROSCOPIC (NOT AT [facility]) - Abnormal; Notable for the following:    APPearance CLOUDY (*)    All other components within normal limits  SEDIMENTATION RATE - Abnormal; Notable for the following:    Sed Rate 100 (*)    All other components within normal limits  C-REACTIVE PROTEIN - Abnormal; Notable for the following:    CRP 19.6 (*)    All other components within normal limits  LIPASE, BLOOD  BASIC METABOLIC PANEL  CBC  I-STAT CG4 LACTIC ACID, ED  I-STAT CG4 LACTIC ACID, ED    Imaging Review Ct Abdomen Pelvis W Contrast  01/15/2015  CLINICAL DATA:  Right-sided abdominal pain, fever, and vomiting for 8 days. Crohn's disease. EXAM: CT ABDOMEN AND PELVIS WITH CONTRAST TECHNIQUE: Multidetector CT imaging of the abdomen and pelvis was performed using the standard protocol following bolus administration of intravenous contrast. CONTRAST:  2m OMNIPAQUE IOHEXOL 300 MG/ML  SOLN COMPARISON:  11/19/2014 FINDINGS: Lower chest:  No acute findings. Hepatobiliary: No masses or other significant abnormality. Pancreas: No mass, inflammatory changes, or other significant abnormality. Spleen: Within normal limits in size and appearance. Adrenals/Urinary Tract: No masses identified. No evidence of hydronephrosis. Stomach/Bowel: Increased wall thickening is seen involving multiple distal small bowel loops in the right abdomen in the ascending colon. Increased inflammatory changes are seen in the  adjacent right abdominal mesentery. Two ill-defined collections are seen within the right lower quadrant mesentery on images 51 and 54 which each measure approximately 3 cm. These are suspicious for small abscesses. Tiny amount of free fluid noted in the pelvis. Vascular/Lymphatic: Mild right abdominal mesenteric lymphadenopathy is mildly increased and likely reactive in etiology. No evidence of abdominal aortic aneurysm. Reproductive: No mass or other significant abnormality. Other: Small fat containing left inguinal hernia remains stable. Musculoskeletal:  No suspicious bone lesions identified. IMPRESSION: Increased wall thickening involving multiple distal small bowel loops and the ascending colon, and increased adjacent mesenteric inflammatory changes. This is consistent with worsening of active Crohn's disease. Two ill-defined collections in right lower quadrant mesentery each measuring approximately 3 cm, suspicious for early abscesses. Electronically Signed   By: JEarle GellM.D.   On: 01/15/2015 20:27   I have personally reviewed and evaluated these images and lab results as part of my medical decision-making.   EKG Interpretation None      MDM   Final diagnoses:  None   Patient is a 49year old male with past medical history significant for diabetes and Crohn's disease (on prednisone), who presents to the emergency department for right-sided abdominal pain associated with nausea, vomiting, bloody stool. Sent from outpatient gastroenterology clinic appointment for CT scan. On arrival no distress, not ill appearing. Temp 100.6, tachycardic in  the 110s, normotensive. Exam as above, notable for right sided tenderness to palpation without signs of rebound, guarding or peritonitis.  Patient's clinical picture is concerning for Crohn's flare vs complication of Crohn's diease. Labs remarkable for leukocytosis of 17.4, hemoglobin 11.7 (baseline 13), PLT 680. ESR 100, CRP 19.6. UA showed no signs of  urinary tract infection. CT abdomen and pelvis obtained, showed increased wall thickening involving multiple distal small bowel loops and descending colon consistent with worsening of active Crohn's disease, concerns two early abscesses.  Given IV fluids, pain medication, antiemetics on arrival to the emergency department.   Gastroenterology consulted, Started on antibiotics and Solu-Medrol after CT abdomen and pelvis showed acute Crohn's flare with abscesses.  Patient admitted to Hospitalist for further management. Stable for the floor. Transferred to the floor in stable condition.    Nathaniel Man, MD 01/16/15 5259  Noemi Chapel, MD 01/17/15 [phone]

## 2015-01-15 NOTE — H&P (Signed)
PCP:   [name], DO   Chief Complaint:  Abdominal pain, feverish  HPI: 49 yo male recently dx with crohns and hospitalized in oct for absesses x 2 about 1 cm each was treated with cipro and flagyl, predisone.  Pt improved, then over the last 8 days he has had progressively worsening rlq abdominal pain similar to what he had back in October.  The last 2 days he has been having chills, usually at night and subjective fevers.  No dysuria.  No cough.  Pt today became very nauseas and vomiting, and did not feel well.  Had follow up appt with dr [name] his GI doctor and was sent for a repeat ct scan of his abd today.  This showed larger absess than previous scanning.  Pt referred for admission for recurrent vs persistent absesses in setting of active crohns disease.  Pt was started on sulfasalazine in October.  He is taking this.  He reports he did complete his full course of abx and steroids from last hospitalization.    Review of Systems:  Positive and negative as per HPI otherwise all other systems are negative  Past Medical History: Past Medical History  Diagnosis Date  . Diabetes mellitus without complication (HCC)   . Crohn's disease (HCC) 11/2014   Past Surgical History  Procedure Laterality Date  . Colonoscopy N/A 11/21/2014    Procedure: COLONOSCOPY;  Surgeon: Carman Ching, MD;  Location[facility] ENDOSCOPY;  Service: Endoscopy;  Laterality: N/A;    Medications: Prior to Admission medications   Medication Sig Start Date End Date Taking? Authorizing Provider  metFORMIN (GLUCOPHAGE) 500 MG tablet Take 1 tablet (500 mg total) by mouth daily with breakfast. 11/21/14  Yes Alexa Dulcy Fanny, MD  sulfaSALAzine (AZULFIDINE) 500 MG EC tablet Take 2 tablets (1,000 mg total) by mouth 3 (three) times daily. 11/21/14  Yes Alexa Dulcy Fanny, MD    Allergies:   Allergies  Allergen Reactions  . Tylenol [Acetaminophen] Shortness Of Breath    Shaky, heart racing    Social History:  reports that he has never smoked. He does not have any smokeless tobacco history on file. He reports that he drinks alcohol. He reports that he does not use illicit drugs.  Family History: No premature CAD  Physical Exam: Filed Vitals:   01/15/15 1633 01/15/15 1930 01/15/15 2200  BP: 127/74 125/75 115/66  Pulse: 119 108 114  Temp: 100.6 F (38.1 C)    TempSrc: Oral    Resp: 18    SpO2: 98% 98% 98%   General appearance: alert, cooperative and no distress Head: Normocephalic, without obvious abnormality, atraumatic Eyes: negative Nose: Nares normal. Septum midline. Mucosa normal. No drainage or sinus tenderness. Neck: no JVD and supple, symmetrical, trachea midline Lungs: clear to auscultation bilaterally Heart: regular rate and rhythm, S1, S2 normal, no murmur, click, rub or gallop Abdomen: soft, non-tender; bowel sounds normal; no masses,  no organomegaly Extremities: extremities normal, atraumatic, no cyanosis or edema Pulses: 2+ and symmetric Skin: Skin color, texture, turgor normal. No rashes or lesions Neurologic: Grossly normal    Labs on Admission:   Recent Labs  01/15/15 1651  NA 136  K 4.0  CL 98*  CO2 28  GLUCOSE 108*  BUN 8  CREATININE 0.84  CALCIUM 9.3    Recent Labs  01/15/15 1651  AST 21  ALT 32  ALKPHOS 192*  BILITOT 0.6  PROT 7.9  ALBUMIN 2.7*    Recent Labs  01/15/15 1651  LIPASE 21    Recent Labs  01/15/15 1651  WBC 17.4*  HGB 11.7*  HCT 36.3*  MCV 78.4  PLT 680*    Radiological Exams on Admission: Ct Abdomen Pelvis W Contrast  01/15/2015  CLINICAL DATA:  Right-sided abdominal pain, fever, and vomiting for 8 days. Crohn's disease. EXAM: CT ABDOMEN AND PELVIS WITH CONTRAST TECHNIQUE: Multidetector CT imaging of the abdomen and pelvis was performed using the standard protocol following bolus administration of intravenous contrast. CONTRAST:  6mL OMNIPAQUE IOHEXOL 300 MG/ML  SOLN COMPARISON:  11/19/2014 FINDINGS: Lower chest:   No acute findings. Hepatobiliary: No masses or other significant abnormality. Pancreas: No mass, inflammatory changes, or other significant abnormality. Spleen: Within normal limits in size and appearance. Adrenals/Urinary Tract: No masses identified. No evidence of hydronephrosis. Stomach/Bowel: Increased wall thickening is seen involving multiple distal small bowel loops in the right abdomen in the ascending colon. Increased inflammatory changes are seen in the adjacent right abdominal mesentery. Two ill-defined collections are seen within the right lower quadrant mesentery on images 51 and 54 which each measure approximately 3 cm. These are suspicious for small abscesses. Tiny amount of free fluid noted in the pelvis. Vascular/Lymphatic: Mild right abdominal mesenteric lymphadenopathy is mildly increased and likely reactive in etiology. No evidence of abdominal aortic aneurysm. Reproductive: No mass or other significant abnormality. Other: Small fat containing left inguinal hernia remains stable. Musculoskeletal:  No suspicious bone lesions identified. IMPRESSION: Increased wall thickening involving multiple distal small bowel loops and the ascending colon, and increased adjacent mesenteric inflammatory changes. This is consistent with worsening of active Crohn's disease. Two ill-defined collections in right lower quadrant mesentery each measuring approximately 3 cm, suspicious for early abscesses. Electronically Signed   By: Myles Rosenthal M.D.   On: 01/15/2015 20:27   Old chart reviewed Case discussed with dr Hyacinth Meeker  Assessment/Plan  49 yo male with crohns disease and intrabdominal absess   Principal Problem:   Crohn disease, with abscess-persistent ,   Will change abx to zosyn, instead of repeat flagyl and cipro again.  Will keep npo in case GI recommends IR to drain area in am and also hold anticoagulants.  Ivf.  Iv solumedrol.  GI has been called and will see in am.  Abdominal exam is benign.  Pt high  risk for needing surgical resection but not needed at this time, will await GI recs.  Active Problems:   Diabetes mellitus (HCC)- hold metformin and place on q 4 hour ssi   Abdominal pain- due to above   Nausea & vomiting- due to above.  Place on iv zofran.  Admit to med surg.  Full code.  Latavia Goga A 01/15/2015, 10:17 PM

## 2015-01-15 NOTE — ED Notes (Signed)
RN attempt to call report to floor; RN to call back  

## 2015-01-15 NOTE — ED Notes (Signed)
C/o aBD PAIN

## 2015-01-16 LAB — BASIC METABOLIC PANEL
ANION GAP: 7 (ref 5–15)
BUN: 6 mg/dL (ref 6–20)
CHLORIDE: 103 mmol/L (ref 101–111)
CO2: 26 mmol/L (ref 22–32)
Calcium: 8.9 mg/dL (ref 8.9–10.3)
Creatinine, Ser: 0.78 mg/dL (ref 0.61–1.24)
GFR calc Af Amer: >60 mL/min (ref >60)
GFR calc non Af Amer: >60 mL/min (ref >60)
Glucose, Bld: 188 mg/dL — ABNORMAL HIGH (ref 65–99)
POTASSIUM: 4.5 mmol/L (ref 3.5–5.1)
SODIUM: 136 mmol/L (ref 135–145)

## 2015-01-16 LAB — GLUCOSE, CAPILLARY
GLUCOSE-CAPILLARY: 166 mg/dL — AB (ref 65–99)
GLUCOSE-CAPILLARY: 160 mg/dL — AB (ref 65–99)
GLUCOSE-CAPILLARY: 189 mg/dL — AB (ref 65–99)
Glucose-Capillary: 96 mg/dL (ref 65–99)
Glucose-Capillary: 150 mg/dL — ABNORMAL HIGH (ref 65–99)
Glucose-Capillary: 205 mg/dL — ABNORMAL HIGH (ref 65–99)

## 2015-01-16 LAB — CBC
HEMATOCRIT: 32.4 % — AB (ref 39.0–52.0)
HEMOGLOBIN: 10.2 g/dL — AB (ref 13.0–17.0)
MCH: 24.8 pg — AB (ref 26.0–34.0)
MCHC: 31.5 g/dL (ref 30.0–36.0)
MCV: 78.6 fL (ref 78.0–100.0)
Platelets: 602 10*3/uL — ABNORMAL HIGH (ref 150–400)
RBC: 4.12 MIL/uL — AB (ref 4.22–5.81)
RDW: 15 % (ref 11.5–15.5)
WBC: 19.1 10*3/uL — AB (ref 4.0–10.5)

## 2015-01-16 MED ORDER — SODIUM CHLORIDE 0.9 % IV SOLN
INTRAVENOUS | Status: AC
  Administered 2015-01-16 (×2): via INTRAVENOUS

## 2015-01-16 NOTE — Progress Notes (Signed)
TRANSFER NOTE  [name] is a 49 y.o. male w/ PMHx of DM type II, and Crohn's Disease, presented to the ED on 01/15/15 w/ complaints of worsening abdominal pain. Patient had recent hospitalization in 11/2014 for Crohn's flare and abscess formation, was treated with ABx and steroids and discharged on Sulfasalazine. Patient states that about 10 days ago, he ran out of all of his medications, including his sulfasalazine Over the past week or so, the patient says his pain has returned and is quite similar to the pain he experienced before. He also admits to subjective fever, chills, nausea, and vomiting for the past 2-3 days. No change in his bowel movements. Repeat CT scan of the abdomen showed increase abscess size than previously noted on his scan in October.   Subjective: Patient says he feels significantly better today. No further nausea, vomiting and chills. Abdominal pain very mild this AM. Says he slept well.   Objective: Vital signs in last 24 hours: Filed Vitals:   01/15/15 2230 01/15/15 2235 01/15/15 2309 01/16/15 0444  BP: 104/60  133/87 118/69  Pulse:   121 89  Temp:   98.7 F (37.1 C) 98.2 F (36.8 C)  TempSrc:   Oral Oral  Resp:   19 20  Weight:  192 lb 7.4 oz (87.3 kg) 189 lb 9.5 oz (86 kg)   SpO2:   98% 94%   Weight change:   Intake/Output Summary (Last 24 hours) at 01/16/15 0747 Last data filed at 01/16/15 3382  Gross per 24 hour  Intake     50 ml  Output      0 ml  Net     50 ml    Physical Exam: General: Hispanic male, alert, cooperative, NAD. HEENT: PERRL, EOMI. Moist mucus membranes Neck: Full range of motion without pain, supple, no lymphadenopathy or carotid bruits Lungs: Clear to ascultation bilaterally, normal work of respiration, no wheezes, rales, rhonchi Heart: RRR, no murmurs, gallops, or rubs Abdomen: Soft, very mild tenderness in the RLQ, non-distended, BS + Extremities: No cyanosis, clubbing, or edema Neurologic: Alert & oriented  x3, cranial nerves II-XII intact, strength grossly intact, sensation intact to light touch   Lab Results: Basic Metabolic Panel:  Recent Labs Lab 01/15/15 1651 01/16/15 0519  NA 136 136  K 4.0 4.5  CL 98* 103  CO2 28 26  GLUCOSE 108* 188*  BUN 8 6  CREATININE 0.84 0.78  CALCIUM 9.3 8.9   Liver Function Tests:  Recent Labs Lab 01/15/15 1651  AST 21  ALT 32  ALKPHOS 192*  BILITOT 0.6  PROT 7.9  ALBUMIN 2.7*    Recent Labs Lab 01/15/15 1651  LIPASE 21   CBC:  Recent Labs Lab 01/15/15 1651 01/16/15 0519  WBC 17.4* 19.1*  HGB 11.7* 10.2*  HCT 36.3* 32.4*  MCV 78.4 78.6  PLT 680* 602*   Urinalysis:  Recent Labs Lab 01/15/15 2039  COLORURINE YELLOW  LABSPEC 1.014  PHURINE 6.5  GLUCOSEU NEGATIVE  HGBUR NEGATIVE  BILIRUBINUR NEGATIVE  KETONESUR NEGATIVE  PROTEINUR NEGATIVE  NITRITE NEGATIVE  LEUKOCYTESUR NEGATIVE   Studies/Results: Ct Abdomen Pelvis W Contrast  01/15/2015  CLINICAL DATA:  Right-sided abdominal pain, fever, and vomiting for 8 days. Crohn's disease. EXAM: CT ABDOMEN AND PELVIS WITH CONTRAST TECHNIQUE: Multidetector CT imaging of the abdomen and pelvis was performed using the standard protocol following bolus administration of intravenous contrast. CONTRAST:  54mL OMNIPAQUE IOHEXOL 300 MG/ML  SOLN COMPARISON:  11/19/2014 FINDINGS: Lower  chest:  No acute findings. Hepatobiliary: No masses or other significant abnormality. Pancreas: No mass, inflammatory changes, or other significant abnormality. Spleen: Within normal limits in size and appearance. Adrenals/Urinary Tract: No masses identified. No evidence of hydronephrosis. Stomach/Bowel: Increased wall thickening is seen involving multiple distal small bowel loops in the right abdomen in the ascending colon. Increased inflammatory changes are seen in the adjacent right abdominal mesentery. Two ill-defined collections are seen within the right lower quadrant mesentery on images 51 and 54 which  each measure approximately 3 cm. These are suspicious for small abscesses. Tiny amount of free fluid noted in the pelvis. Vascular/Lymphatic: Mild right abdominal mesenteric lymphadenopathy is mildly increased and likely reactive in etiology. No evidence of abdominal aortic aneurysm. Reproductive: No mass or other significant abnormality. Other: Small fat containing left inguinal hernia remains stable. Musculoskeletal:  No suspicious bone lesions identified. IMPRESSION: Increased wall thickening involving multiple distal small bowel loops and the ascending colon, and increased adjacent mesenteric inflammatory changes. This is consistent with worsening of active Crohn's disease. Two ill-defined collections in right lower quadrant mesentery each measuring approximately 3 cm, suspicious for early abscesses. Electronically Signed   By: Myles Rosenthal M.D.   On: 01/15/2015 20:27    Medications: I have reviewed the patient's current medications. Scheduled Meds: . insulin aspart  0-9 Units Subcutaneous 6 times per day  . methylPREDNISolone (SOLU-MEDROL) injection  80 mg Intravenous Q12H  . piperacillin-tazobactam (ZOSYN)  IV  3.375 g Intravenous Q8H  . sulfaSALAzine  1,000 mg Oral TID   Continuous Infusions: . sodium chloride 75 mL/hr at 01/15/15 2317   PRN Meds:.morphine injection, ondansetron **OR** ondansetron (ZOFRAN) IV   Assessment/Plan: 49 y.o. male w/ PMHx of DM type II, and Crohn's Disease admitted for worsening abdominal pain, likely related to recurrent Crohn's flare.   Crohn's Disease: Patient recently diagnosed in 11/2014, colonoscopy on 11/21/14 significant for Crohns of the ileocecal valve and terminal ileum (confirmed with biopsy), also identified on previous imaging. CT scan from 11/19/14 shows findings suggestive of Crohn's of the distal ileum and cecum with focal inflammation of the adjacent mesentery and 2 corresponding small <1 cm abscesses. During that admission, patient was treated with  Cipro + Flagyl, started on Sulfasalazine, and given a course of steroids as well. Patient states he had been feeling great at home and had been compliant with his medications until he ran out about 10 days ago. Roughly a week ago, the patient says he started to have worsening abdominal pain and more recently (within the past 2-3 days) had some nausea, vomiting, and subjective fever. No change in bowel habits. No hematochezia or melena. Hb decreased to 10.2 today. Ferritin on 11/19/14 was 142. CT scan yesterday shows wall thickening of multiple distal small bowel loops and the ascending colon with adjacent mesenteric inflammation consistent with worsening disease. There are still 2 collections suspicious for abscess that have increased in size, no approximately 3 cm.  -GI to see today, discussed with Dr. Evette Cristal -Continue Zosyn for now; can likely change to po today or tomorrow -Continue Solu-Medrol 80 mg q12h for now; can likely change to po Prednisone tomorrow -Continue Sulfasalazine 1000 mg tid -NPO for now; sips with meds; can likely change to clears/full liquids if no GI/IR intervention of abscesses -IVF; NS @ 125 cc/hr for 12 hours -Morphine 2 mg q4h prn for pain -Zofran prn for nausea  ?DM type II: HbA1c 6.6 on 11/19/14, rechecked on 11/29/14 and was 5.9. Started on Metformin 500  mg daily during recent admission.  -Hold Metformin -ISS  DVT/PE PPx: SCD's for now. Add Lovenox later today if no procedure  Dispo: Disposition is deferred at this time, awaiting improvement of current medical problems.  Anticipated discharge in approximately 1-2 day(s).   The patient does have a current PCP [name], DO) and does need an [facility] hospital follow-up appointment after discharge.  The patient does not have transportation limitations that hinder transportation to clinic appointments.  .Services Needed at time of discharge: Y = Yes, Blank = No PT:   OT:   RN:   Equipment:   Other:     LOS: 1 day    Courtney Paris, MD 01/16/2015, 7:47 AM

## 2015-01-16 NOTE — Consult Note (Signed)
Subjective:   HPI  The patient is a 49 year old male who was 70diagnosed with Crohn's disease in October of this year. He was discharged from the hospital on mesalamine and prednisone. He had a colonoscopy by Dr. [name]. He also had a CT scan his last admission. He went home on mesalamine as well as prednisone. He states he did not know that he was supposed to get a refill. He comes back into the hospital with nausea some vomiting and some abdominal pain. A repeat CT scan was done showing evidence of Crohn's disease as well as a couple of areas suggestive of possible early abscesses.  Review of Systems No chest pain or shortness of breath  Past Medical History  Diagnosis Date  . Diabetes mellitus without complication (HCC)   . Crohn's disease (HCC) 11/2014   Past Surgical History  Procedure Laterality Date  . Colonoscopy N/A 11/21/2014    Procedure: COLONOSCOPY;  Surgeon: Carman Ching, MD;  Location: [facility] ENDOSCOPY;  Service: Endoscopy;  Laterality: N/A;   Social History   Social History  . Marital Status: Single    Spouse Name: N/A  . Number of Children: N/A  . Years of Education: N/A   Occupational History  . Not on file.   Social History Main Topics  . Smoking status: Never Smoker   . Smokeless tobacco: Not on file  . Alcohol Use: Yes     Comment: socially  . Drug Use: No  . Sexual Activity: Not on file   Other Topics Concern  . Not on file   Social History Narrative   family history is not on file.  Current facility-administered medications:  .  0.9 %  sodium chloride infusion, , Intravenous, Continuous, Courtney Paris, MD, Last Rate: 125 mL/hr at 01/16/15 1407 .  insulin aspart (novoLOG) injection 0-9 Units, 0-9 Units, Subcutaneous, 6 times per day, Haydee Monica, MD, 1 Units at 01/16/15 1245 .  methylPREDNISolone sodium succinate (SOLU-MEDROL) 125 mg/2 mL injection 80 mg, 80 mg, Intravenous, Q12H, Haydee Monica, MD, 80 mg at 01/16/15 1118 .  morphine 2 MG/ML  injection 2 mg, 2 mg, Intravenous, Q4H PRN, Haydee Monica, MD .  ondansetron (ZOFRAN) tablet 4 mg, 4 mg, Oral, Q6H PRN **OR** ondansetron (ZOFRAN) injection 4 mg, 4 mg, Intravenous, Q6H PRN, Haydee Monica, MD .  piperacillin-tazobactam (ZOSYN) IVPB 3.375 g, 3.375 g, Intravenous, Q8H, Cassie L Stewart, RPH, 3.375 g at 01/16/15 1244 .  sulfaSALAzine (AZULFIDINE) EC tablet 1,000 mg, 1,000 mg, Oral, TID, Haydee Monica, MD, 1,000 mg at 01/16/15 1119 Allergies  Allergen Reactions  . Tylenol [Acetaminophen] Shortness Of Breath    Shaky, heart racing     Objective:     BP 125/72 mmHg  Pulse 95  Temp(Src) 97.7 F (36.5 C) (Oral)  Resp 20  Wt 86 kg (189 lb 9.5 oz)  SpO2 96%  He is in no distress  Nonicteric  Heart regular rhythm no murmurs  Lungs clear  Abdomen: Bowel sounds normal, soft, nontender  Laboratory No components found for: D1    Assessment:     Crohn's disease  Abnormal CT scan. I reviewed the findings of the CT scan with the radiologist and he feels that these 2 areas seen which may suggest early abscesses are currently in fact more consistent with phlegmon. There is nothing at this time recommended to drain.        Plan:     I would recommend continuing antibiotics. Given the  severity of his disease, he will probably be a candidate for a biologic such as Remicade. They should not be started now however in light of possible early infection. He currently has no more abdominal pain and feels much better. I would say that if he does well in the next day or 2 he can probably go home on antibiotic therapy and follow-up with Dr. [name] as an outpatient. Further consideration for beginning a biologic can be given. Follow-up imaging with another CT scan also needs to be considered in the near future.

## 2015-01-17 DIAGNOSIS — Z7984 Long term (current) use of oral hypoglycemic drugs: Secondary | ICD-10-CM

## 2015-01-17 LAB — CBC
HEMATOCRIT: 37.6 % — AB (ref 39.0–52.0)
HEMOGLOBIN: 11.9 g/dL — AB (ref 13.0–17.0)
MCH: 25.1 pg — ABNORMAL LOW (ref 26.0–34.0)
MCHC: 31.6 g/dL (ref 30.0–36.0)
MCV: 79.2 fL (ref 78.0–100.0)
Platelets: 875 10*3/uL — ABNORMAL HIGH (ref 150–400)
RBC: 4.75 MIL/uL (ref 4.22–5.81)
RDW: 15.1 % (ref 11.5–15.5)
WBC: 35 10*3/uL — AB (ref 4.0–10.5)

## 2015-01-17 LAB — BASIC METABOLIC PANEL
Anion gap: 12 (ref 5–15)
BUN: 8 mg/dL (ref 6–20)
CHLORIDE: 102 mmol/L (ref 101–111)
CO2: 28 mmol/L (ref 22–32)
Calcium: 9.4 mg/dL (ref 8.9–10.3)
Creatinine, Ser: 0.87 mg/dL (ref 0.61–1.24)
GFR calc Af Amer: >60 mL/min (ref >60)
GFR calc non Af Amer: >60 mL/min (ref >60)
GLUCOSE: 138 mg/dL — AB (ref 65–99)
POTASSIUM: 3.8 mmol/L (ref 3.5–5.1)
Sodium: 142 mmol/L (ref 135–145)

## 2015-01-17 LAB — RETICULOCYTES
RBC.: 4.75 MIL/uL (ref 4.22–5.81)
Retic Count, Absolute: 85.5 10*3/uL (ref 19.0–186.0)
Retic Ct Pct: 1.8 % (ref 0.4–3.1)

## 2015-01-17 LAB — GLUCOSE, CAPILLARY
GLUCOSE-CAPILLARY: 131 mg/dL — AB (ref 65–99)
GLUCOSE-CAPILLARY: 138 mg/dL — AB (ref 65–99)
Glucose-Capillary: 149 mg/dL — ABNORMAL HIGH (ref 65–99)
Glucose-Capillary: 189 mg/dL — ABNORMAL HIGH (ref 65–99)

## 2015-01-17 MED ORDER — SULFASALAZINE 500 MG PO TBEC: 1000.0000 mg | DELAYED_RELEASE_TABLET | Freq: Three times a day (TID) | ORAL | Status: DC

## 2015-01-17 MED ORDER — PREDNISONE 50 MG PO TABS
50.0000 mg | ORAL_TABLET | Freq: Two times a day (BID) | ORAL | Status: DC
  Administered 2015-01-17: 50 mg via ORAL
  Filled 2015-01-17: qty 1

## 2015-01-17 MED ORDER — METRONIDAZOLE 500 MG PO TABS
500.0000 mg | ORAL_TABLET | Freq: Three times a day (TID) | ORAL | Status: DC
  Administered 2015-01-17: 500 mg via ORAL
  Filled 2015-01-17: qty 1

## 2015-01-17 MED ORDER — CIPROFLOXACIN HCL 500 MG PO TABS
500.0000 mg | ORAL_TABLET | Freq: Two times a day (BID) | ORAL | Status: DC
  Administered 2015-01-17: 500 mg via ORAL
  Filled 2015-01-17: qty 1

## 2015-01-17 MED ORDER — METRONIDAZOLE 500 MG PO TABS: 500.0000 mg | ORAL_TABLET | Freq: Three times a day (TID) | ORAL | Status: AC

## 2015-01-17 MED ORDER — CIPROFLOXACIN HCL 500 MG PO TABS: 500.0000 mg | ORAL_TABLET | Freq: Two times a day (BID) | ORAL | Status: AC

## 2015-01-17 MED ORDER — PREDNISONE 20 MG PO TABS: ORAL_TABLET | ORAL | Status: AC

## 2015-01-17 NOTE — Progress Notes (Signed)
Patient discharge teaching given, including activity, diet, follow-up appoints, and medications. Patient verbalized understanding of all discharge instructions. IV access was d/c'd. Vitals are stable. Skin is intact except as charted in most recent assessments. Pt to be escorted out by NT, to be driven home by family.  Yeshua Stryker, MBA, BS, RN 

## 2015-01-17 NOTE — Discharge Summary (Signed)
Name: Barry Carson MRN: [id number] DOB: Jun 07, 1965 49 y.o. PCP: Barry Burly, DO  Date of Admission: 01/15/2015  6:06 PM Date of Discharge: 01/17/2015 Attending Physician: [name], MD  Discharge Diagnosis: 1. Crohn's disease with abscess 2. Type 2 Diabetes  Discharge Medications:   Medication List    TAKE these medications        ciprofloxacin 500 MG tablet  Commonly known as:  CIPRO  Take 1 tablet (500 mg total) by mouth 2 (two) times daily.     metFORMIN 500 MG tablet  Commonly known as:  GLUCOPHAGE  Take 1 tablet (500 mg total) by mouth daily with breakfast.     metroNIDAZOLE 500 MG tablet  Commonly known as:  FLAGYL  Take 1 tablet (500 mg total) by mouth every 8 (eight) hours.  Notes to Patient:  6am, 2pm, 10pm     predniSONE 20 MG tablet  Commonly known as:  DELTASONE  Take 2 tablets twice a day for 3 days. Take 1 tablet twice a day  for 3 days. Take 1 tablet everyday afterwards.     sulfaSALAzine 500 MG EC tablet  Commonly known as:  AZULFIDINE  Take 2 tablets (1,000 mg total) by mouth 3 (three) times daily.  Notes to Patient:  6am, 2pm, 10pm        Disposition and follow-up:   Mr.Barry Carson was discharged from [facility] in Good condition.  At the hospital follow up visit please address:  1.  Ensure patient has sustained improvement in abdominal pain and that he has appropriately tapered his prednisone and completed his antibiotics.  2.  Labs / imaging needed at time of follow-up: None  3.  Pending labs/ test needing follow-up: None  Follow-up Appointments:     Follow-up Information    Follow up with [name], MD On 02/13/2015.   Specialty:  Gastroenterology   Why:  At 8:15. Please call before your appointment to discuss insurance and self-pay options.   Contact information:   [address] [place] [address] [phone]       Follow up with Barry Burly, DO On  01/24/2015.   Specialty:  Internal Medicine   Why:  9:15 appt   Contact information:   [address] [place] [address] [phone]       Discharge Instructions: Discharge Instructions    Diet - low sodium heart healthy    Complete by:  As directed      Increase activity slowly    Complete by:  As directed           ONCE YOU RUN OUT OF MEDICATIONS, THERE ARE REFILLS AVAILABLE FOR YOU.  PLEASE FOLLOW UP WITH GASTROENTEROLOGY AND WITH THE INTERNAL MEDICINE CLINIC.  Consultations: Treatment Team:  Barry Shiver, MD  Procedures Performed:  Ct Abdomen Pelvis W Contrast  01/15/2015  CLINICAL DATA:  Right-sided abdominal pain, fever, and vomiting for 8 days. Crohn's disease. EXAM: CT ABDOMEN AND PELVIS WITH CONTRAST TECHNIQUE: Multidetector CT imaging of the abdomen and pelvis was performed using the standard protocol following bolus administration of intravenous contrast. CONTRAST:  80mL OMNIPAQUE IOHEXOL 300 MG/ML  SOLN COMPARISON:  11/19/2014 FINDINGS: Lower chest:  No acute findings. Hepatobiliary: No masses or other significant abnormality. Pancreas: No mass, inflammatory changes, or other significant abnormality. Spleen: Within normal limits in size and appearance. Adrenals/Urinary Tract: No masses identified. No evidence of hydronephrosis. Stomach/Bowel: Increased wall thickening is seen involving  multiple distal small bowel loops in the right abdomen in the ascending colon. Increased inflammatory changes are seen in the adjacent right abdominal mesentery. Two ill-defined collections are seen within the right lower quadrant mesentery on images 51 and 54 which each measure approximately 3 cm. These are suspicious for small abscesses. Tiny amount of free fluid noted in the pelvis. Vascular/Lymphatic: Mild right abdominal mesenteric lymphadenopathy is mildly increased and likely reactive in etiology. No evidence of abdominal aortic aneurysm. Reproductive: No mass or other significant  abnormality. Other: Small fat containing left inguinal hernia remains stable. Musculoskeletal:  No suspicious bone lesions identified. IMPRESSION: Increased wall thickening involving multiple distal small bowel loops and the ascending colon, and increased adjacent mesenteric inflammatory changes. This is consistent with worsening of active Crohn's disease. Two ill-defined collections in right lower quadrant mesentery each measuring approximately 3 cm, suspicious for early abscesses. Electronically Signed   By: Myles Rosenthal M.D.   On: 01/15/2015 20:27    Admission HPI: 49 yo male recently dx with crohns and hospitalized in oct for absesses x 2 about 1 cm each was treated with cipro and flagyl, predisone. Pt improved, then over the last 8 days he has had progressively worsening rlq abdominal pain similar to what he had back in October. The last 2 days he has been having chills, usually at night and subjective fevers. No dysuria. No cough. Pt today became very nauseas and vomiting, and did not feel well. Had follow up appt with dr Barry Carson his GI doctor and was sent for a repeat ct scan of his abd today. This showed larger absess than previous scanning. Pt referred for admission for recurrent vs persistent absesses in setting of active crohns disease. Pt was started on sulfasalazine in October. He is taking this. He reports he did complete his full course of abx and steroids from last hospitalization.   Hospital Course by problem list: Principal Problem:   Crohn disease, with abscess Active Problems:   Diabetes mellitus (HCC)   Abdominal pain   Nausea & vomiting   Crohn's Disease with Abscess: CT of the abdomen was initially interpreted as progressed disease, but review by gastroenterologist suggested that possibly early abscesses could in fact be phlegmon. It was surmised that his symptoms could be attributed to non-adherence to sulfasalazine and prednisone rather than a Crohn's flair. He was  initially treated with 80 mg IV solumedrol (11/30 - 12/1) and 1000 mg sulfasalazine TID. His steroid was transitioned to prednisone 50 mg po BID, and he was provided a taper schedule which was discussed with him (40 mg BID for 3 days, 40 mg daily for 3 days, then 20 mg daily). He was also started on a total 7 day course of oral metronidazole and ciprofloxacin, to completed after discharge. His abdominal pain and nausea had entirely resolved by day of discharge.  Type 2 Diabetes: Blood glucoses typically <200 in the hospital and managed with SSI. Discharged on home metformin.   Discharge Vitals:   BP 119/78 mmHg  Pulse 77  Temp(Src) 98.7 F (37.1 C) (Oral)  Resp 20  Wt 190 lb 4.1 oz (86.3 kg)  SpO2 98%  Discharge Labs:  Results for orders placed or performed during the hospital encounter of 01/15/15 (from the past 24 hour(s))  Glucose, capillary     Status: Abnormal   Collection Time: 01/16/15  7:51 PM  Result Value Ref Range   Glucose-Capillary 189 (H) 65 - 99 mg/dL  Glucose, capillary  Status: Abnormal   Collection Time: 01/17/15 12:38 AM  Result Value Ref Range   Glucose-Capillary 131 (H) 65 - 99 mg/dL  Glucose, capillary     Status: Abnormal   Collection Time: 01/17/15  4:09 AM  Result Value Ref Range   Glucose-Capillary 149 (H) 65 - 99 mg/dL  CBC     Status: Abnormal   Collection Time: 01/17/15  7:34 AM  Result Value Ref Range   WBC 35.0 (H) 4.0 - 10.5 K/uL   RBC 4.75 4.22 - 5.81 MIL/uL   Hemoglobin 11.9 (L) 13.0 - 17.0 g/dL   HCT 60.4 (L) 54.0 - 98.1 %   MCV 79.2 78.0 - 100.0 fL   MCH 25.1 (L) 26.0 - 34.0 pg   MCHC 31.6 30.0 - 36.0 g/dL   RDW 19.1 47.8 - 29.5 %   Platelets 875 (H) 150 - 400 K/uL  Basic metabolic panel     Status: Abnormal   Collection Time: 01/17/15  7:34 AM  Result Value Ref Range   Sodium 142 135 - 145 mmol/L   Potassium 3.8 3.5 - 5.1 mmol/L   Chloride 102 101 - 111 mmol/L   CO2 28 22 - 32 mmol/L   Glucose, Bld 138 (H) 65 - 99 mg/dL   BUN 8 6  - 20 mg/dL   Creatinine, Ser 6.21 0.61 - 1.24 mg/dL   Calcium 9.4 8.9 - 30.8 mg/dL   GFR calc non Af Amer >60 >60 mL/min   GFR calc Af Amer >60 >60 mL/min   Anion gap 12 5 - 15  Reticulocytes     Status: None   Collection Time: 01/17/15  7:34 AM  Result Value Ref Range   Retic Ct Pct 1.8 0.4 - 3.1 %   RBC. 4.75 4.22 - 5.81 MIL/uL   Retic Count, Manual 85.5 19.0 - 186.0 K/uL  Glucose, capillary     Status: Abnormal   Collection Time: 01/17/15  8:13 AM  Result Value Ref Range   Glucose-Capillary 138 (H) 65 - 99 mg/dL  Glucose, capillary     Status: Abnormal   Collection Time: 01/17/15 12:29 PM  Result Value Ref Range   Glucose-Capillary 189 (H) 65 - 99 mg/dL    Signed: Ruben Im, MD 01/17/2015, 6:07 PM

## 2015-01-17 NOTE — Discharge Instructions (Signed)
TAKE SULFASALAZINE 2 PILLS THREE TIMES A DAY EVERY DAY.  TAKE METRONIDAZOLE THREE TIMES A DAY UNTIL YOU RUN OUT.  TAKE CIPROFLOXACIN TWICE A DAY UNTIL YOU RUN OUT.  TAKE PREDNISONE AS FOLLOWS:  Take 2 tablets twice a day for 3 days. Take 1 tablet twice a day  for 3 days. Take 1 tablet everyday afterwards.  ONCE YOU RUN OUT OF MEDICATIONS, THERE ARE REFILLS AVAILABLE FOR YOU.  PLEASE FOLLOW UP WITH GASTROENTEROLOGY AND WITH THE INTERNAL MEDICINE CLINIC.

## 2015-01-17 NOTE — Progress Notes (Signed)
Subjective:  Barry Carson was seen and examined this morning. He reported that his abdominal pain had entirely resolved and denied any nausea. He reported feeling well enough for discharge today.   Objective: Vital signs in last 24 hours: Filed Vitals:   01/16/15 1726 01/16/15 1952 01/17/15 0418 01/17/15 0430  BP: 125/74 105/78 119/78   Pulse: 93 96 77   Temp: 98.8 F (37.1 C) 98.4 F (36.9 C) 98.7 F (37.1 C)   TempSrc: Oral Oral Oral   Resp: Weight:  190 lb 4.1 oz (86.3 kg)  190 lb 4.1 oz (86.3 kg)  SpO2: 98% 97% 98%    Weight change: -2 lb 3.3 oz (-1 kg)  Intake/Output Summary (Last 24 hours) at 01/17/15 1631 Last data filed at 01/17/15 4098  Gross per 24 hour  Intake    240 ml  Output      0 ml  Net    240 ml   Physical Exam: General: Sitting in bed, no acute distress Cardiovascular: RRR, no m/r/g Lungs: Clear to ausculation bilaterally Abdomen: Soft, NT/ND. Normal bowel sounds Extremities: no clubbing, cyanosis, or edema  Lab Results: Basic Metabolic Panel:  Recent Labs Lab 01/16/15 0519 01/17/15 0734  NA 136 142  K 4.5 3.8  CL 103 102  CO2 26 28  GLUCOSE 188* 138*  BUN 6 8  CREATININE 0.78 0.87  CALCIUM 8.9 9.4  CBC:  Recent Labs Lab 01/16/15 0519 01/17/15 0734  WBC 19.1* 35.0*  HGB 10.2* 11.9*  HCT 32.4* 37.6*  MCV 78.6 79.2  PLT 602* 875*   CBG:  Recent Labs Lab 01/16/15 1649 01/16/15 1951 01/17/15 0038 01/17/15 0409 01/17/15 0813 01/17/15 1229  GLUCAP 205* 189* 131* 149* 138* 189*  Anemia Panel:  Recent Labs Lab 01/17/15 0734  RETICCTPCT 1.8     Medications: I have reviewed the patient's current medications. Scheduled Meds: . ciprofloxacin  500 mg Oral BID  . insulin aspart  0-9 Units Subcutaneous 6 times per day  . metroNIDAZOLE  500 mg Oral 3 times per day  . predniSONE  50 mg Oral BID WC  . sulfaSALAzine  1,000 mg Oral TID   Continuous Infusions:  PRN Meds:. Assessment/Plan: Principal Problem:  Crohn disease, with abscess Active Problems:   Diabetes mellitus (HCC)   Abdominal pain   Nausea & vomiting  Crohn's Disease with Abscesses: It is unlikely that this represents a Crohn's flare, and his abdominal pain likely emerged due to being off treatment for 10 days. He has improved significantly on steroids and sulfasalazine. He is tolerating a diet, which can be advanced. Dr. Evette Cristal, Gastroenterologist, reviewed the CT abdomen and thinks that previously identified early abscesses are more consistent with phlegmon. Drainage was not recommended. He is stable for discharge. He was informed to continue taking his Crohn's medication, and not stop, otherwise his symptoms would recur.    - Transition to prednisone 50 mg po BID. He will taper as an outpatient, taking 40 mg BID 3 days total, then 40 mg for 3 days, then 20 mg daily thereafter. - Sulfasalazine 1000 mg TID.  - Outpatient GI follow-up - 7 day course of ciprofloxacin 500 mg BID with Metronidazole 500 mg TID  DM type II: HbA1c 6.6 on 11/19/14, rechecked on 11/29/14 and was 5.9. Started on Metformin 500 mg daily during recent admission.  -Hold Metformin -ISS  DVT/PE PPx: SCDs  Dispo: Discharge today.  The patient does have a current PCP Barry Burly,  DO) and does need an [facility] hospital follow-up appointment after discharge.  The patient does not have transportation limitations that hinder transportation to clinic appointments.  .Services Needed at time of discharge: Y = Yes, Blank = No PT:   OT:   RN:   Equipment:   Other:     LOS: 2 days   Ruben Im, MD 01/17/2015, 4:31 PM

## 2015-01-24 ENCOUNTER — Encounter: Payer: Self-pay | Admitting: Internal Medicine

## 2015-01-24 ENCOUNTER — Ambulatory Visit: Payer: Self-pay | Admitting: Internal Medicine

## 2015-03-06 ENCOUNTER — Encounter: Payer: Self-pay | Admitting: Internal Medicine

## 2016-02-12 ENCOUNTER — Other Ambulatory Visit: Payer: Self-pay | Admitting: Internal Medicine

## 2016-02-17 ENCOUNTER — Encounter: Payer: Self-pay | Admitting: Internal Medicine

## 2016-02-17 NOTE — Telephone Encounter (Signed)
Last visit 11/2014-will forward refill request to pcp and sent to front office for an appt.Barry Spittle Cassady1/2/20188:32 AM

## 2016-02-18 ENCOUNTER — Encounter: Payer: Self-pay | Admitting: Internal Medicine

## 2016-03-17 ENCOUNTER — Telehealth: Payer: Self-pay | Admitting: Internal Medicine

## 2016-03-17 NOTE — Telephone Encounter (Signed)
APT. REMINDER CALL, NO ANSWER, NO VOICEMAIL °

## 2016-03-18 ENCOUNTER — Encounter: Payer: Self-pay | Admitting: Internal Medicine

## 2016-09-17 IMAGING — CT CT ABD-PELV W/ CM
2 of 5 series · 10 of 46 positions shown, 11 images · IV contrast (Iodine)
Comparison: None.

CLINICAL DATA: Right lower quadrant pain for 3-4 months. Night
sweats.

EXAM:
CT ABDOMEN AND PELVIS WITH CONTRAST
TECHNIQUE: Multidetector CT imaging of the abdomen and pelvis was performed
using the standard protocol following bolus administration of
intravenous contrast.
CONTRAST:  100mL OMNIPAQUE IOHEXOL 300 MG/ML  SOLN

[Series 201: routine, idose (2) · axial · 0.80mm/px · z∈[+141,+556]mm · 7 of 105 slices shown, 8 images]
[im 11/105  soft-tissue]
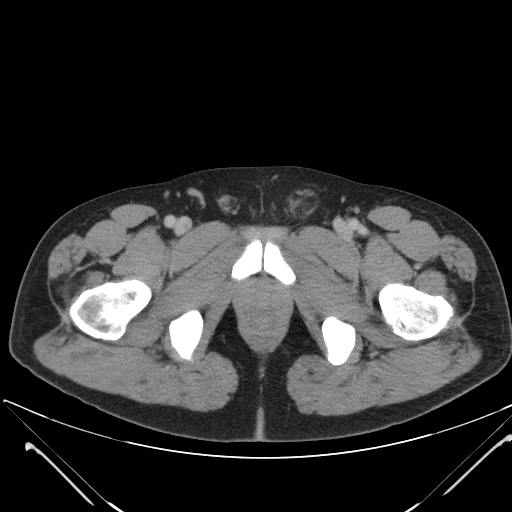
[im 11/105  bone]
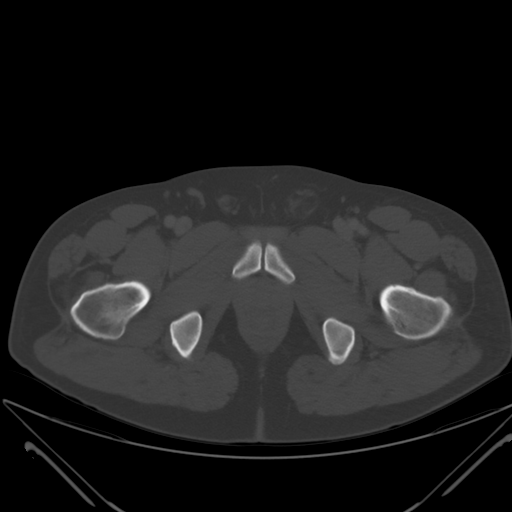
[im 22/105  soft-tissue]
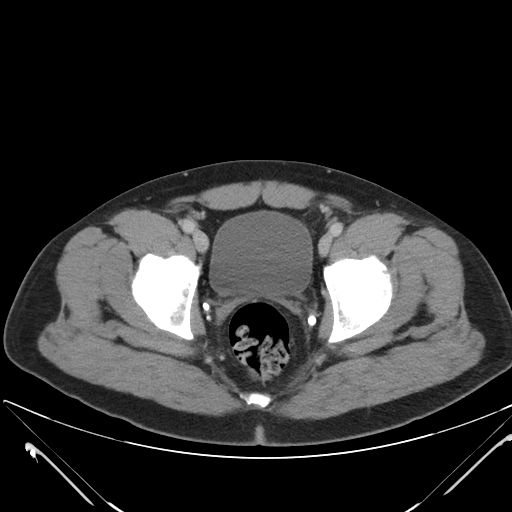
[im 39/105  soft-tissue]
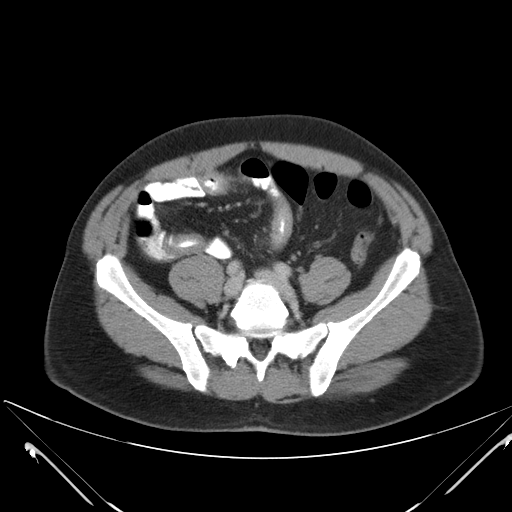
[im 55/105  soft-tissue]
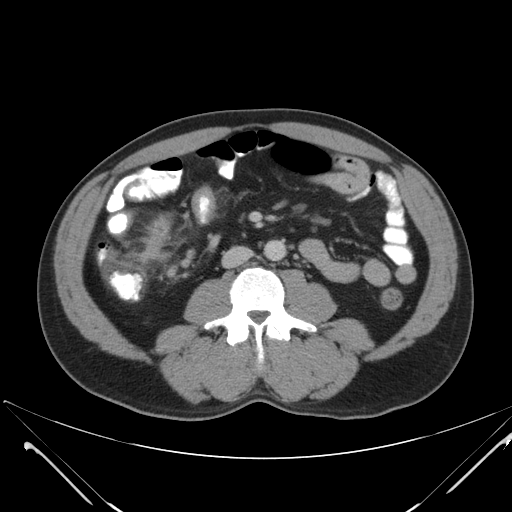
[im 66/105  soft-tissue]
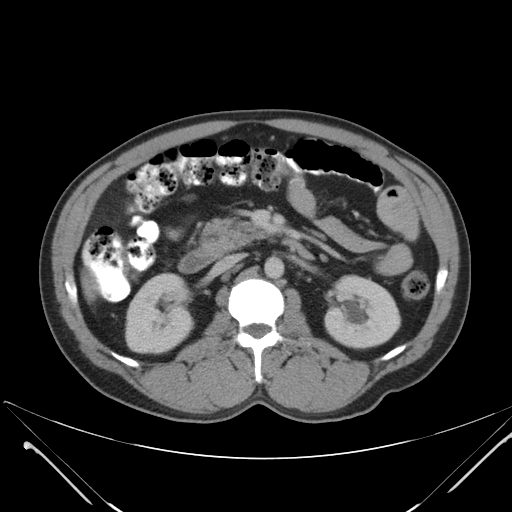
[im 83/105  soft-tissue]
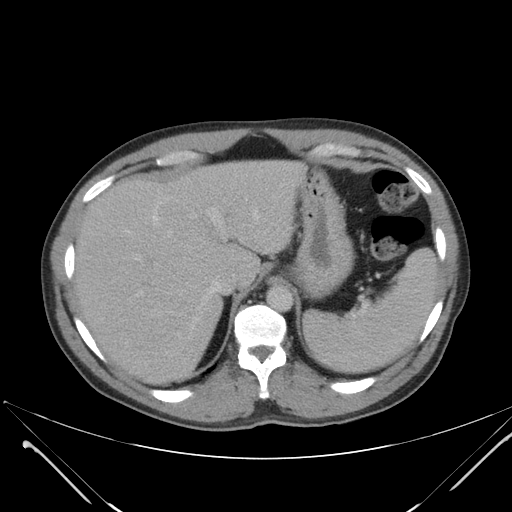
[im 94/105  soft-tissue]
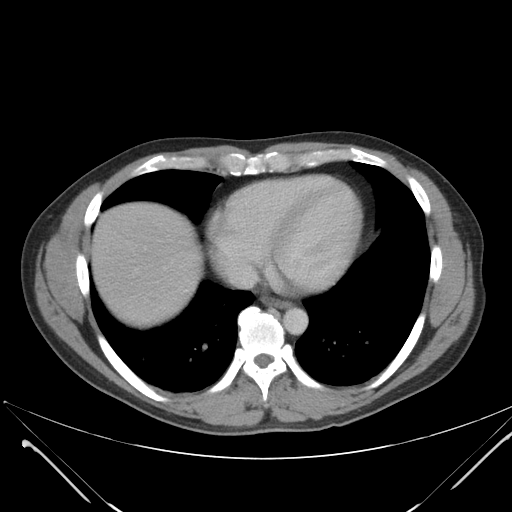

[Series 203: coronals, idose (2) · coronal · 0.45mm/px · 3 of 132 slices shown]
[im 44/132  soft-tissue]
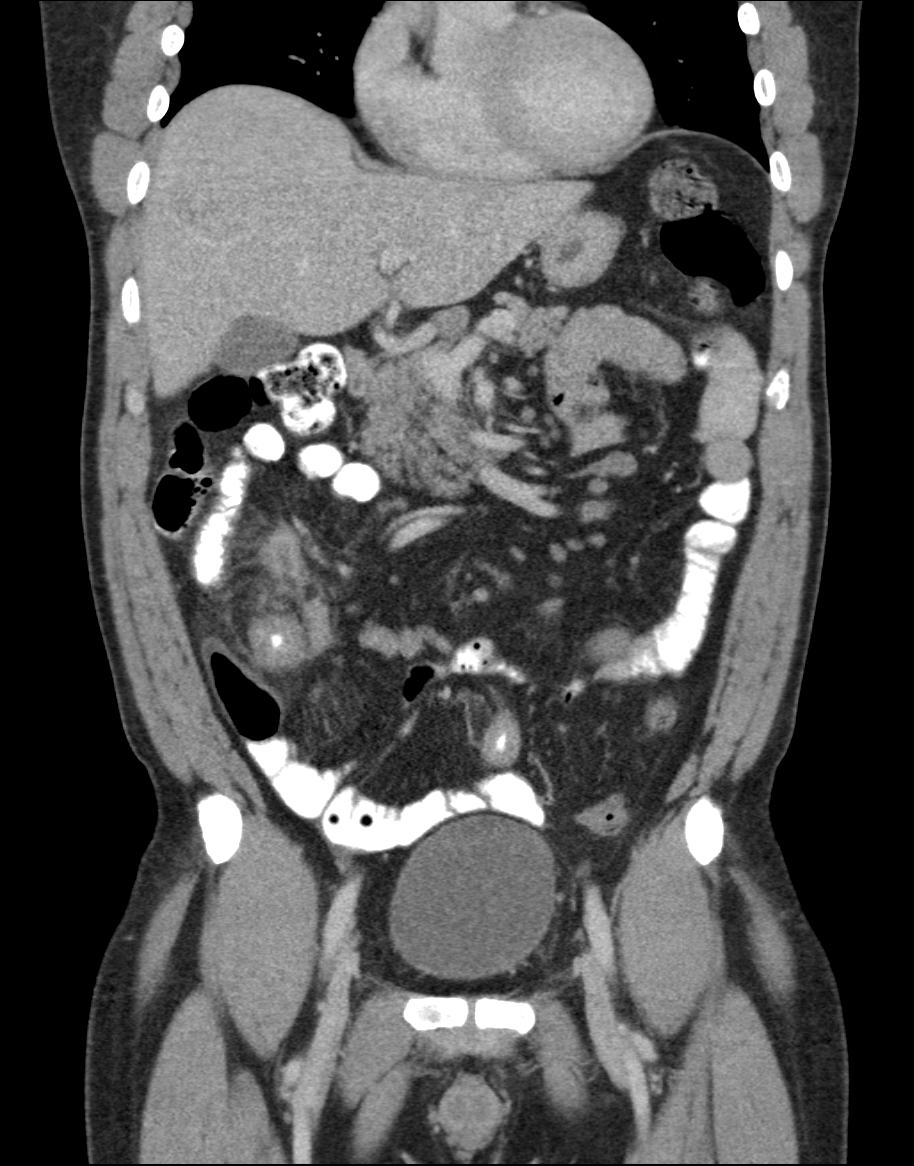
[im 59/132  soft-tissue]
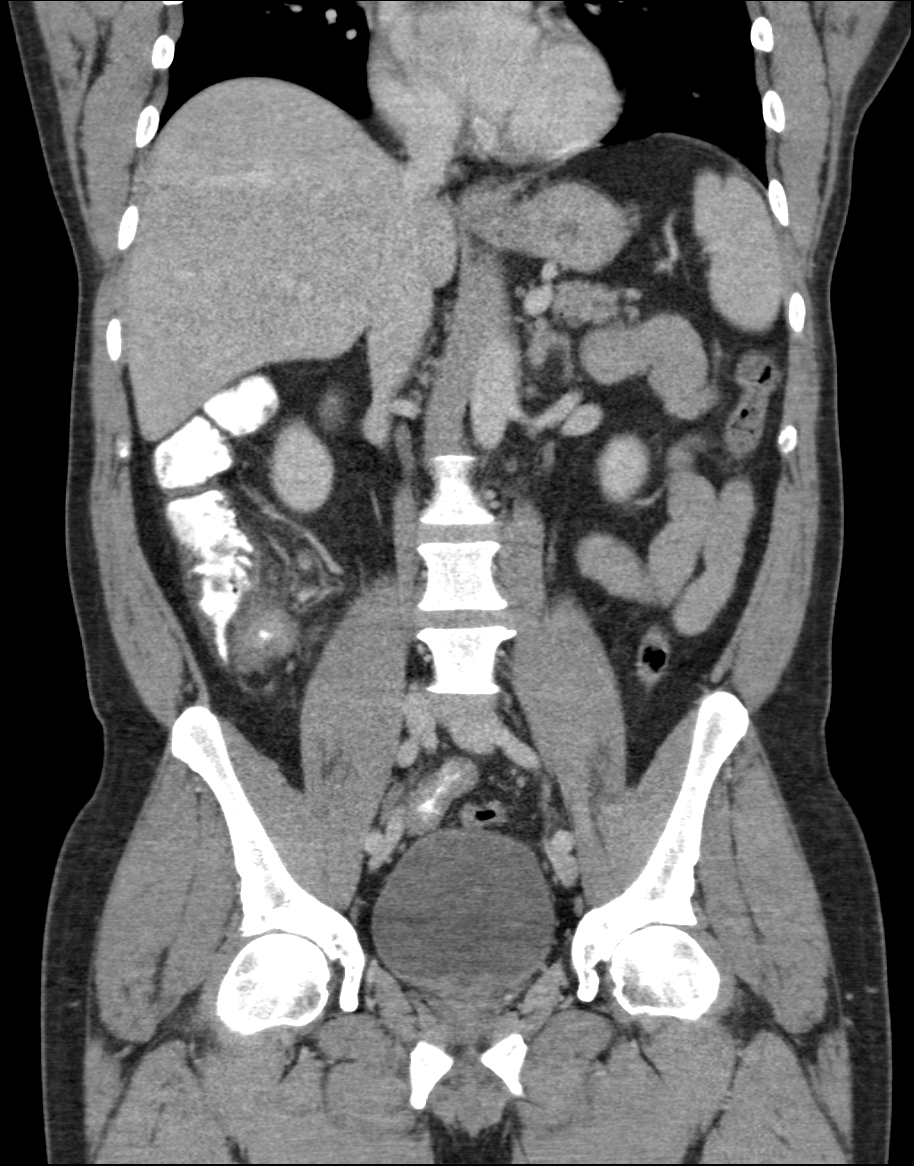
[im 73/132  soft-tissue]
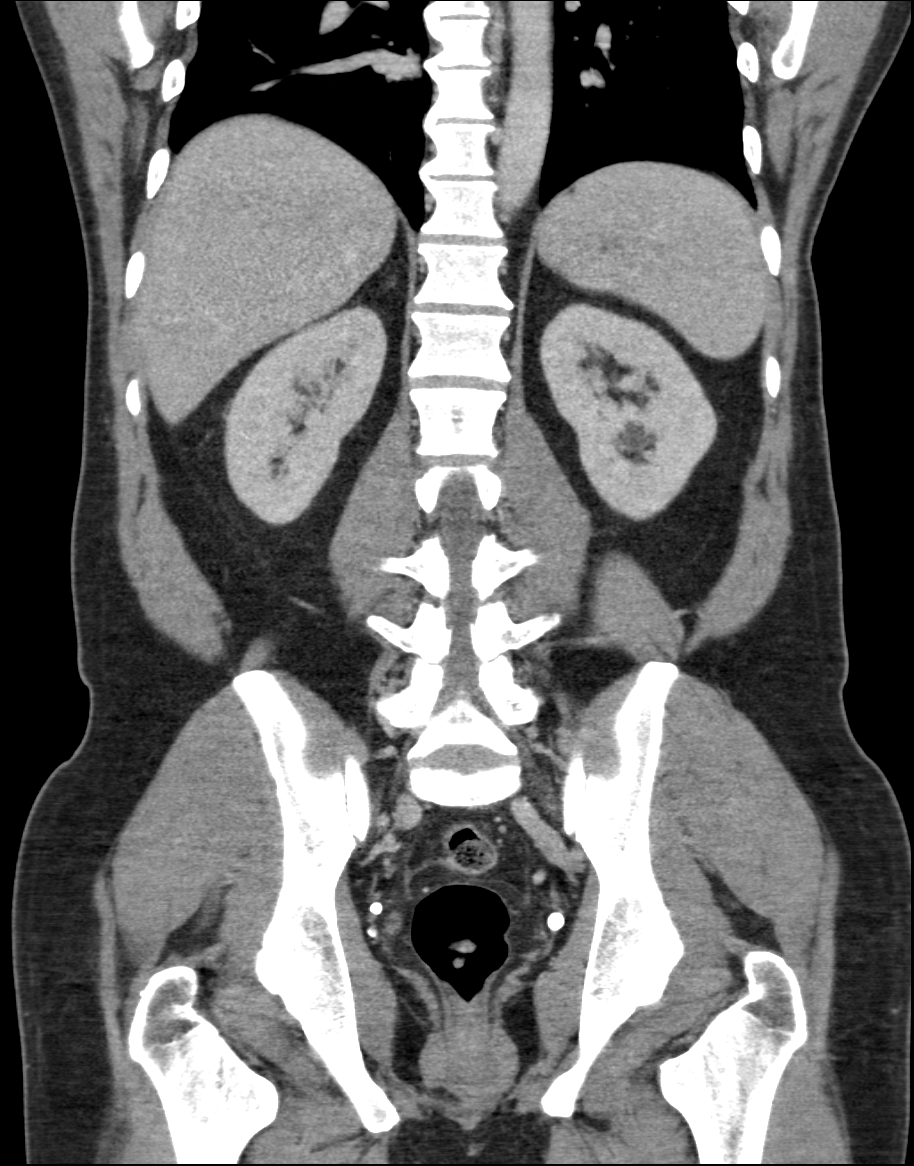

[10 of 46 positions shown; findings below may reference images not displayed]

FINDINGS: Lower chest:  Normal.

Hepatobiliary: Normal.

Pancreas: Normal.

Spleen: Normal.

Adrenals/Urinary Tract: Normal adrenal glands. Normal kidneys,
ureters, and bladder.

Stomach/Bowel: There is mucosal thickening of multiple loops of the
distal ileum including the terminal ileum. There is marked
inflammation in the mesenteries adjacent to the ileocecal valve with
two tiny areas which may represent abscesses in the mesenteric. No
free air or free fluid. The appendix is immediately adjacent to the
area of inflammation and there is small area of mucosal thickening
in the mid appendix which is felt to be due to the adjacent ileitis
and not due to appendicitis. There is slight thickening of the
mucosa of the cecum. The remainder of the bowel appears normal.

Vascular/Lymphatic: The vessels are normal. Multiple small reactive
nodes in the mesenteries of the bowel.

Reproductive: Small left inguinal hernia containing only fat.
Otherwise normal.

Musculoskeletal: No significant abnormality.
IMPRESSION: Findings consistent with extensive Crohn's disease of the distal
ileum and cecum with focal inflammation in the mesentery adjacent at
the ileocecal valve with 2 tiny abscesses in the mesentery each
measuring less than 1 cm in size.

## 2017-03-30 ENCOUNTER — Telehealth: Payer: Self-pay | Admitting: Internal Medicine

## 2017-03-30 NOTE — Telephone Encounter (Signed)
Attempted to contact patient this morning to get him scheduled to see Dr.Wallace.  No answer and voicemail was not set up.
